# Patient Record
Sex: Male | Born: 1955 | Race: White | Hispanic: No | Marital: Married | Smoking: Former smoker
Health system: Southern US, Community
[De-identification: ages and names within clinical notes are randomized; demographics above are authoritative.]

## PROBLEM LIST (undated history)

## (undated) DIAGNOSIS — Z860100 Personal history of colon polyps, unspecified: Secondary | ICD-10-CM

## (undated) DIAGNOSIS — M51369 Other intervertebral disc degeneration, lumbar region without mention of lumbar back pain or lower extremity pain: Secondary | ICD-10-CM

## (undated) DIAGNOSIS — Z8489 Family history of other specified conditions: Secondary | ICD-10-CM

## (undated) DIAGNOSIS — I10 Essential (primary) hypertension: Secondary | ICD-10-CM

## (undated) DIAGNOSIS — E7800 Pure hypercholesterolemia, unspecified: Secondary | ICD-10-CM

## (undated) DIAGNOSIS — E785 Hyperlipidemia, unspecified: Secondary | ICD-10-CM

## (undated) DIAGNOSIS — R972 Elevated prostate specific antigen [PSA]: Secondary | ICD-10-CM

## (undated) DIAGNOSIS — F419 Anxiety disorder, unspecified: Secondary | ICD-10-CM

## (undated) DIAGNOSIS — C61 Malignant neoplasm of prostate: Secondary | ICD-10-CM

## (undated) DIAGNOSIS — J309 Allergic rhinitis, unspecified: Secondary | ICD-10-CM

## (undated) DIAGNOSIS — R7301 Impaired fasting glucose: Secondary | ICD-10-CM

## (undated) HISTORY — PX: COLONOSCOPY: SHX174

## (undated) HISTORY — DX: Impaired fasting glucose: R73.01

## (undated) HISTORY — DX: Personal history of colon polyps, unspecified: Z86.0100

## (undated) HISTORY — DX: Pure hypercholesterolemia, unspecified: E78.00

## (undated) HISTORY — DX: Hyperlipidemia, unspecified: E78.5

## (undated) HISTORY — DX: Other intervertebral disc degeneration, lumbar region without mention of lumbar back pain or lower extremity pain: M51.369

## (undated) HISTORY — DX: Allergic rhinitis, unspecified: J30.9

---

## 2011-03-22 HISTORY — PX: FOOT SURGERY: SHX648

## 2012-02-06 ENCOUNTER — Other Ambulatory Visit (HOSPITAL_COMMUNITY): Payer: Self-pay | Admitting: Urology

## 2012-02-06 DIAGNOSIS — R972 Elevated prostate specific antigen [PSA]: Secondary | ICD-10-CM

## 2012-02-20 ENCOUNTER — Other Ambulatory Visit (HOSPITAL_COMMUNITY): Payer: Self-pay | Admitting: Urology

## 2012-02-20 ENCOUNTER — Ambulatory Visit (HOSPITAL_COMMUNITY)
Admission: RE | Admit: 2012-02-20 | Discharge: 2012-02-20 | Disposition: A | Discharge status: Home / Self Care | Payer: PRIVATE HEALTH INSURANCE | Source: Ambulatory Visit | Attending: Urology | Admitting: Urology

## 2012-02-20 ENCOUNTER — Other Ambulatory Visit: Payer: Self-pay | Admitting: Urology

## 2012-02-20 DIAGNOSIS — IMO0002 Reserved for concepts with insufficient information to code with codable children: Secondary | ICD-10-CM

## 2012-02-20 DIAGNOSIS — R972 Elevated prostate specific antigen [PSA]: Secondary | ICD-10-CM | POA: Insufficient documentation

## 2012-02-20 LAB — POCT I-STAT, CHEM 8
BUN: 20 mg/dL (ref 6–23)
Creatinine, Ser: 1.2 mg/dL (ref 0.50–1.35)
Potassium: 3.4 mEq/L — ABNORMAL LOW (ref 3.5–5.1)
Sodium: 142 mEq/L (ref 135–145)
TCO2: 29 mmol/L (ref 0–100)

## 2012-02-20 MED ORDER — GADOBENATE DIMEGLUMINE 529 MG/ML IV SOLN
20.0000 mL | Freq: Once | INTRAVENOUS | Status: AC | PRN
  Administered 2012-02-20: 20 mL via INTRAVENOUS

## 2013-02-16 IMAGING — CR DG ORBITS FOR FOREIGN BODY
2 series · 2 of 2 positions shown · non-contrast
Comparison: None.

CLINICAL DATA: Metal exposure to the eyes.  Pre MR evaluation.

ORBITS FOR FOREIGN BODY - 2 VIEW

[w waters *]
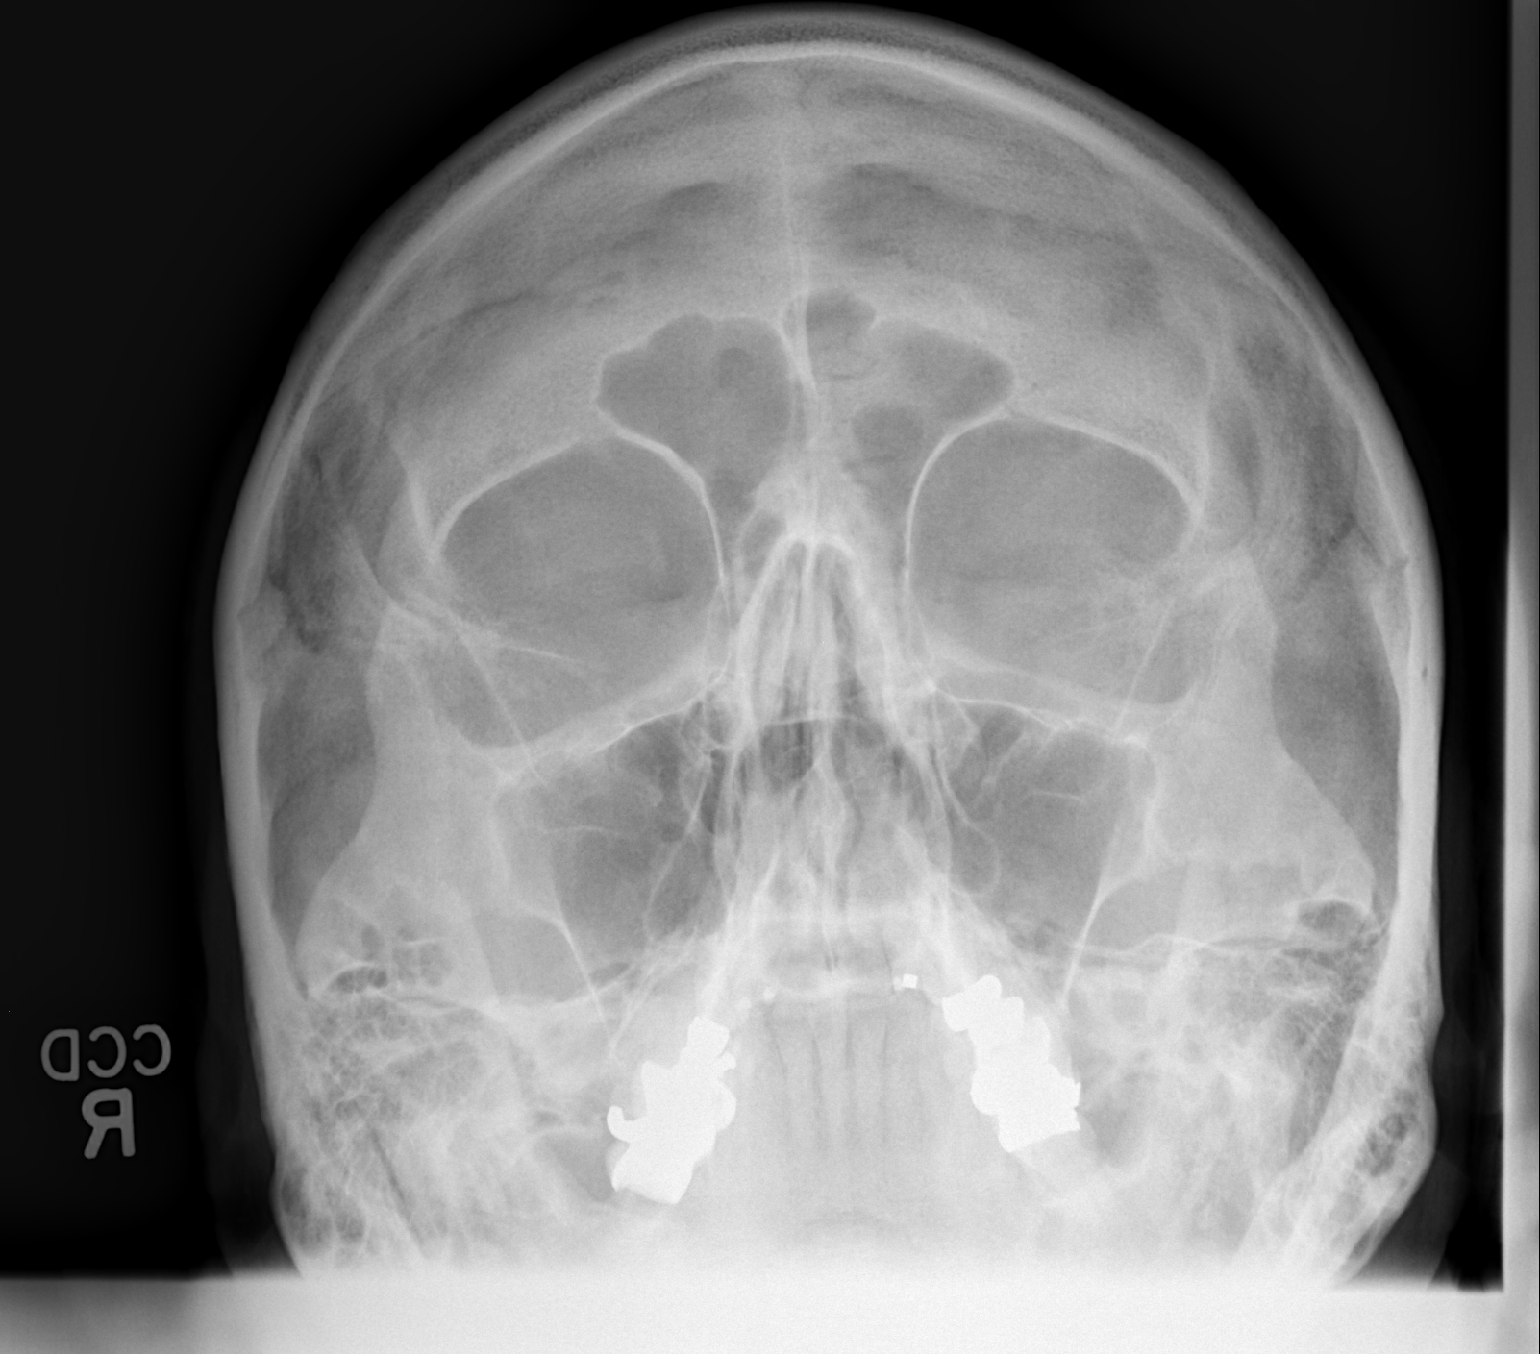

[w waters]
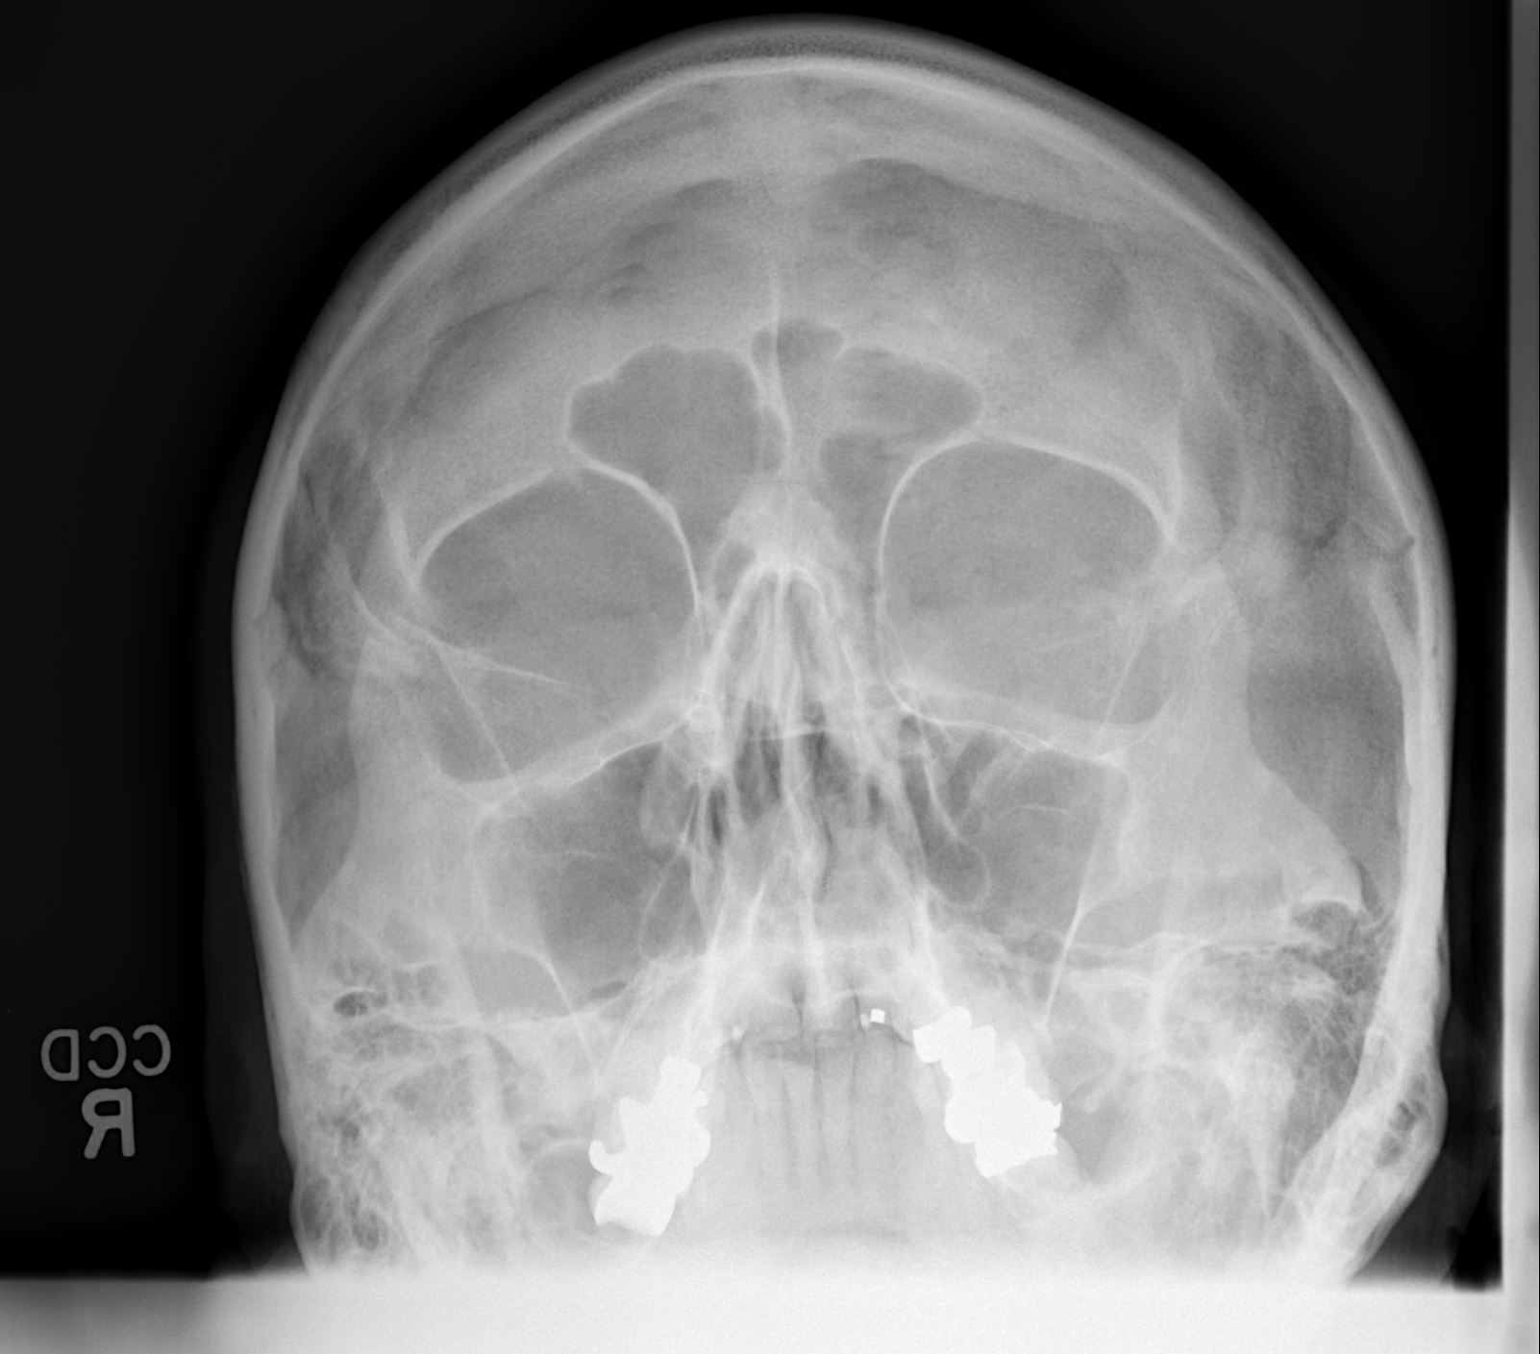

[2 of 2 positions shown; findings below may reference images not displayed]

FINDINGS: Normal appearing orbits with no orbital metallic foreign
bodies seen.
IMPRESSION: No orbital metallic foreign body.

## 2013-09-23 ENCOUNTER — Emergency Department (INDEPENDENT_AMBULATORY_CARE_PROVIDER_SITE_OTHER): Payer: No Typology Code available for payment source

## 2013-09-23 ENCOUNTER — Emergency Department (INDEPENDENT_AMBULATORY_CARE_PROVIDER_SITE_OTHER)
Admission: EM | Admit: 2013-09-23 | Discharge: 2013-09-23 | Disposition: A | Discharge status: Home / Self Care | Payer: No Typology Code available for payment source | Source: Home / Self Care | Attending: Emergency Medicine | Admitting: Emergency Medicine

## 2013-09-23 ENCOUNTER — Encounter (HOSPITAL_COMMUNITY): Payer: Self-pay | Admitting: Emergency Medicine

## 2013-09-23 DIAGNOSIS — M76899 Other specified enthesopathies of unspecified lower limb, excluding foot: Secondary | ICD-10-CM

## 2013-09-23 DIAGNOSIS — M6580 Other synovitis and tenosynovitis, unspecified site: Secondary | ICD-10-CM

## 2013-09-23 HISTORY — DX: Essential (primary) hypertension: I10

## 2013-09-23 MED ORDER — HYDROCODONE-ACETAMINOPHEN 5-325 MG PO TABS: ORAL_TABLET | ORAL | Status: DC

## 2013-09-23 MED ORDER — MELOXICAM 15 MG PO TABS: 15.0000 mg | ORAL_TABLET | Freq: Every day | ORAL | Status: DC

## 2013-09-23 MED ORDER — PREDNISONE 20 MG PO TABS: ORAL_TABLET | ORAL | Status: DC

## 2013-09-23 NOTE — Discharge Instructions (Signed)
Knee pain can be caused by many conditions:  Osteoarthritis, gout, bursitis, tendonitis, cartiledge damage, condromalacia patella, patellofemoral syndrome, and ligament sprain to name just a few.  Often some simple conservative measures can help alleviate the pain.  Do not do the following:  Avoid squatting and doing deep knee bends.  This puts too much of load on your cartiledges and tendons.  If you do a knee bend, go only half way down, flexing your knee no more than 90 degrees.  Do the following:  If you are overweight or obese, lose weight.  This makes for a lot less load on your knee joints.  If you use tobacco, quit.  Nicotine causes spasm of the small arteries, decreases blood flow, and impairs your body's normal ability to repair damage.  If your knee is acutely inflamed, use the principles of RICE (rest, ice, compression, and elevation).  Wearing a knee brace can help.  These are usually made of neoprene and can be purchased over the counter at the drug store.  Use of over the counter pain meds can be of help.  Tylenol (or acetaminophen) is the safest to use.  It often helps to take this regularly.  You can take up to 2 325 mg tablets 5 times daily, but it best to start out much lower that that, perhaps 2 325 mg tablets twice daily, then increase from there. People who are on the blood thinner warfarin have to be careful about taking high doses of Tylenol.  For people who are able to tolerate them, ibuprofen and naproxyn can also help with the pain.  You should discuss these agents with your physician before taking them.  People with chronic kidney disease, hypertension, peptic ulcer disease, and reflux can suffer adverse side effects. They should not be taken with warfarin. The maximum dosage of ibuprofen is 800 mg 3 times daily with meals.  The maximum dosage of naprosyn is 2 and 1/2 tablets twice daily with food, but again, start out low and gradually increase the dose until adequate  pain relief is achieved. Ibuprofen and naprosyn should always be taken with food.  People with cartiledge injury or osteoarthritis may find glucosamine to be helpful.  This is an over-the-counter supplement that helps nourish and repair cartiledge.  The dose is 500 mg 3 times daily or 1500 mg taken in a single dose. This can take several months to work and it doesn't always work.    For people with knee pain on just one side, use of a cane held in the hand on the same side as the knee pain takes some of the stress off the knee joint and can make a big difference in knee pain.  Wearing good shoes with adequate arch support is essential.  Regular exercise is of utmost importance.  Swimming, water aerobics, or use of an elliptical exerciser put the least stress on the knees of any exercise.  Finally doing the exercises below can be very helpful.  They tend to strengthen the muscles around the knee and provide extra support and stability.  Try to do them twice a day followed by ice for 10 minutes.

## 2013-09-23 NOTE — ED Provider Notes (Signed)
Chief Complaint   Chief Complaint  Patient presents with  . Knee Pain    History of Present Illness   Gerald Wolfe is a 58 year old male who is on his feet for hours per day and his work. He's had a three-week history of left medial knee pain. He denies any injury or swelling. The pain is worse with walking, climbing steps, or if he twists his foot when he sitting or lying. It is better with Aleve. There is no locking, popping, catching of the joint. No weakness or giving way the name. He denies any injury or trauma.  Review of Systems   Other than as noted above, the patient denies any of the following symptoms: Systemic:  No fevers or chills. Musculoskeletal:  No arthritis, swelling, or joint pain.  Neurological:  No muscular weakness or paresthesias.  [place]   Past medical history, family history, social history, meds, and allergies were reviewed.   He's allergic to penicillin. He takes blood pressure medication including lisinopril for high blood pressure.  Physical Examination   Vital signs:  BP 176/83  Pulse 55  Temp(Src) 98.3 F (36.8 C) (Oral)  Resp 12  SpO2 100% Gen:  Alert and oriented times 3.  In no distress. Musculoskeletal: There is tenderness to palpation over the insertion of the semi-tendinosis and semimembranosus. There is no swelling. The knee has a full range of motion with some pain on flexion.   McMurray's test was negative.  Lachman's test was negative.  Anterior drawer test was negative.   Varus and valgus stress negative.  Otherwise, all joints had a full a ROM with no swelling, bruising or deformity.  No edema, pulses full. Extremities were warm and pink.  Capillary refill was brisk.  Skin:  Clear, warm and dry.  No rash. Neuro:  Alert and oriented times 3.  Muscle strength was normal.  Sensation was intact to light touch.   Radiology   Dg Knee Complete 4 Views Left  09/23/2013   CLINICAL DATA:  Left knee pain.  EXAM: LEFT KNEE - COMPLETE 4+ VIEW   COMPARISON:  None.  FINDINGS: Mild proliferative changes involving the tibial spines. No evidence of fracture, dislocation or joint effusion. No bony lesions are seen. Soft tissues are unremarkable.  IMPRESSION: No acute findings. Mild proliferative changes seen involving the tibial spines.   Electronically Signed   By: Aletta Edouard M.D.   On: 09/23/2013 09:57   I reviewed the images independently and personally and concur with the radiologist's findings.    Course in [facility] [place]   He was placed in a knee sleeve.  Assessment   The encounter diagnosis was Knee tendonitis.  Suspect probable tendinopathy at the insertion of the semi-tendinosis/semimembranosus. Differential diagnosis includes bursitis or car tear.  Plan    1.  Meds:  The following meds were prescribed:   Discharge Medication List as of 09/23/2013 10:18 AM    START taking these medications   Details  HYDROcodone-acetaminophen (NORCO/VICODIN) 5-325 MG per tablet 1 to 2 tabs every 4 to 6 hours as needed for pain., Print    meloxicam (MOBIC) 15 MG tablet Take 1 tablet (15 mg total) by mouth daily., Starting 09/23/2013, Until Discontinued, Normal    predniSONE (DELTASONE) 20 MG tablet Take 3 daily for 5 days, 2 daily for 5 days, 1 daily for 5 days., Normal        2.  Patient Education/Counseling:  The patient was given appropriate handouts, self care instructions, and  instructed in symptomatic relief, including rest and activity, elevation, application of ice and compression.  Given the exercises.  3.  Follow up:  The patient was told to follow up here if no better in 3 to 4 days, or sooner if becoming worse in any way, and given some red flag symptoms such as worsening pain or neurological symptoms which would prompt immediate return.  Followup with Dr. Maxie Better if no improvement in 2 weeks.     Harden Mo, MD 09/23/13 [phone]

## 2013-09-23 NOTE — ED Notes (Signed)
Provider in before nurse.  Pt triaged and assessed by provider

## 2014-04-09 ENCOUNTER — Other Ambulatory Visit: Payer: Self-pay | Admitting: Family Medicine

## 2014-04-09 ENCOUNTER — Ambulatory Visit
Admission: RE | Admit: 2014-04-09 | Discharge: 2014-04-09 | Disposition: A | Discharge status: Home / Self Care | Payer: No Typology Code available for payment source | Source: Ambulatory Visit | Attending: Family Medicine | Admitting: Family Medicine

## 2014-04-09 DIAGNOSIS — M25551 Pain in right hip: Secondary | ICD-10-CM

## 2015-01-09 ENCOUNTER — Other Ambulatory Visit: Payer: Self-pay | Admitting: Urology

## 2015-01-16 ENCOUNTER — Encounter (HOSPITAL_BASED_OUTPATIENT_CLINIC_OR_DEPARTMENT_OTHER): Payer: Self-pay | Admitting: *Deleted

## 2015-01-16 NOTE — Progress Notes (Addendum)
NPO AFTER MN.  ARRIVE AT 0930.  NEEDS ISTAT 8 AND EKG.  WILL TAKE METOPROLOL AM DOS W/ SIPS OF WATER AND DO FLEET ENEMA.  PT TO CALL BACK WITH MEDICATION DOSEAGE OR BRING DOS TO UPDATE.

## 2015-01-23 ENCOUNTER — Encounter (HOSPITAL_BASED_OUTPATIENT_CLINIC_OR_DEPARTMENT_OTHER)
Admission: RE | Disposition: A | Discharge status: Home / Self Care | Payer: Self-pay | Source: Ambulatory Visit | Attending: Urology

## 2015-01-23 ENCOUNTER — Ambulatory Visit (HOSPITAL_BASED_OUTPATIENT_CLINIC_OR_DEPARTMENT_OTHER): Payer: BLUE CROSS/BLUE SHIELD | Admitting: Anesthesiology

## 2015-01-23 ENCOUNTER — Encounter (HOSPITAL_BASED_OUTPATIENT_CLINIC_OR_DEPARTMENT_OTHER): Payer: Self-pay | Admitting: Anesthesiology

## 2015-01-23 ENCOUNTER — Other Ambulatory Visit: Payer: Self-pay

## 2015-01-23 ENCOUNTER — Ambulatory Visit (HOSPITAL_BASED_OUTPATIENT_CLINIC_OR_DEPARTMENT_OTHER)
Admission: RE | Admit: 2015-01-23 | Discharge: 2015-01-23 | Disposition: A | Discharge status: Home / Self Care | Payer: BLUE CROSS/BLUE SHIELD | Source: Ambulatory Visit | Attending: Urology | Admitting: Urology

## 2015-01-23 DIAGNOSIS — I10 Essential (primary) hypertension: Secondary | ICD-10-CM | POA: Diagnosis not present

## 2015-01-23 DIAGNOSIS — Z79899 Other long term (current) drug therapy: Secondary | ICD-10-CM | POA: Diagnosis not present

## 2015-01-23 DIAGNOSIS — Z87891 Personal history of nicotine dependence: Secondary | ICD-10-CM | POA: Diagnosis not present

## 2015-01-23 DIAGNOSIS — C61 Malignant neoplasm of prostate: Secondary | ICD-10-CM | POA: Diagnosis not present

## 2015-01-23 DIAGNOSIS — R972 Elevated prostate specific antigen [PSA]: Secondary | ICD-10-CM | POA: Diagnosis present

## 2015-01-23 HISTORY — DX: Anxiety disorder, unspecified: F41.9

## 2015-01-23 HISTORY — DX: Malignant neoplasm of prostate: C61

## 2015-01-23 HISTORY — DX: Elevated prostate specific antigen (PSA): R97.20

## 2015-01-23 HISTORY — PX: PROSTATE BIOPSY: SHX241

## 2015-01-23 LAB — POCT I-STAT, CHEM 8
BUN: 19 mg/dL (ref 6–20)
CALCIUM ION: 1.21 mmol/L (ref 1.13–1.30)
CREATININE: 1.1 mg/dL (ref 0.61–1.24)
Chloride: 104 mmol/L (ref 101–111)
Glucose, Bld: 96 mg/dL (ref 65–99)
HEMATOCRIT: 45 % (ref 39.0–52.0)
HEMOGLOBIN: 15.3 g/dL (ref 13.0–17.0)
Potassium: 3.4 mmol/L — ABNORMAL LOW (ref 3.5–5.1)
SODIUM: 145 mmol/L (ref 135–145)
TCO2: 24 mmol/L (ref 0–100)

## 2015-01-23 SURGERY — BIOPSY, PROSTATE, RECTAL APPROACH, WITH US GUIDANCE
Anesthesia: General | Site: Prostate

## 2015-01-23 MED ORDER — LACTATED RINGERS IV SOLN
INTRAVENOUS | Status: DC
  Administered 2015-01-23: 10:00:00 via INTRAVENOUS
  Filled 2015-01-23: qty 1000

## 2015-01-23 MED ORDER — FENTANYL CITRATE (PF) 100 MCG/2ML IJ SOLN
INTRAMUSCULAR | Status: DC | PRN
  Administered 2015-01-23 (×4): 25 ug via INTRAVENOUS

## 2015-01-23 MED ORDER — MIDAZOLAM HCL 2 MG/2ML IJ SOLN
INTRAMUSCULAR | Status: AC
  Filled 2015-01-23: qty 2

## 2015-01-23 MED ORDER — LIDOCAINE HCL 2 % IJ SOLN
INTRAMUSCULAR | Status: DC | PRN
  Administered 2015-01-23: 10 mL

## 2015-01-23 MED ORDER — KETOROLAC TROMETHAMINE 30 MG/ML IJ SOLN
INTRAMUSCULAR | Status: DC | PRN
  Administered 2015-01-23: 30 mg via INTRAVENOUS

## 2015-01-23 MED ORDER — FENTANYL CITRATE (PF) 100 MCG/2ML IJ SOLN: INTRAMUSCULAR | Status: DC | PRN

## 2015-01-23 MED ORDER — PROPOFOL 10 MG/ML IV BOLUS: INTRAVENOUS | Status: DC | PRN

## 2015-01-23 MED ORDER — DEXAMETHASONE SODIUM PHOSPHATE 10 MG/ML IJ SOLN
INTRAMUSCULAR | Status: DC | PRN
  Administered 2015-01-23: 10 mg via INTRAVENOUS

## 2015-01-23 MED ORDER — ONDANSETRON HCL 4 MG/2ML IJ SOLN
INTRAMUSCULAR | Status: DC | PRN
  Administered 2015-01-23: 4 mg via INTRAVENOUS

## 2015-01-23 MED ORDER — GENTAMICIN IN SALINE 1.6-0.9 MG/ML-% IV SOLN
80.0000 mg | INTRAVENOUS | Status: AC
  Administered 2015-01-23: 80 mg via INTRAVENOUS
  Filled 2015-01-23 (×2): qty 50

## 2015-01-23 MED ORDER — TRAMADOL HCL 50 MG PO TABS: 50.0000 mg | ORAL_TABLET | Freq: Four times a day (QID) | ORAL | Status: DC | PRN

## 2015-01-23 MED ORDER — FENTANYL CITRATE (PF) 100 MCG/2ML IJ SOLN
25.0000 ug | INTRAMUSCULAR | Status: DC | PRN
  Filled 2015-01-23: qty 1

## 2015-01-23 MED ORDER — PROPOFOL 10 MG/ML IV BOLUS
INTRAVENOUS | Status: DC | PRN
  Administered 2015-01-23: 200 mg via INTRAVENOUS

## 2015-01-23 MED ORDER — MIDAZOLAM HCL 5 MG/5ML IJ SOLN
INTRAMUSCULAR | Status: DC | PRN
  Administered 2015-01-23: 2 mg via INTRAVENOUS

## 2015-01-23 MED ORDER — PROMETHAZINE HCL 25 MG/ML IJ SOLN
6.2500 mg | INTRAMUSCULAR | Status: DC | PRN
  Filled 2015-01-23: qty 1

## 2015-01-23 MED ORDER — FENTANYL CITRATE (PF) 100 MCG/2ML IJ SOLN
INTRAMUSCULAR | Status: AC
  Filled 2015-01-23: qty 4

## 2015-01-23 MED ORDER — LIDOCAINE HCL (CARDIAC) 20 MG/ML IV SOLN
INTRAVENOUS | Status: DC | PRN
  Administered 2015-01-23: 80 mg via INTRAVENOUS

## 2015-01-23 SURGICAL SUPPLY — 6 items
GLOVE BIO SURGEON STRL SZ8 (GLOVE) ×2 IMPLANT
NEEDLE SPNL 22GX7 QUINCKE BK (NEEDLE) ×2 IMPLANT
SURGILUBE 2OZ TUBE FLIPTOP (MISCELLANEOUS) ×2 IMPLANT
SYR CONTROL 10ML LL (SYRINGE) ×2 IMPLANT
TOWEL OR 17X24 6PK STRL BLUE (TOWEL DISPOSABLE) ×2 IMPLANT
UNDERPAD 30X30 INCONTINENT (UNDERPADS AND DIAPERS) ×6 IMPLANT

## 2015-01-23 NOTE — Discharge Instructions (Signed)
°  Post Anesthesia Home Care Instructions  Activity: Get plenty of rest for the remainder of the day. A responsible adult should stay with you for 24 hours following the procedure.  For the next 24 hours, DO NOT: -Drive a car -Paediatric nurse -Drink alcoholic beverages -Take any medication unless instructed by your physician -Make any legal decisions or sign important papers.  Meals: Start with liquid foods such as gelatin or soup. Progress to regular foods as tolerated. Avoid greasy, spicy, heavy foods. If nausea and/or vomiting occur, drink only clear liquids until the nausea and/or vomiting subsides. Call your physician if vomiting continues.  Special Instructions/Symptoms: Your throat may feel dry or sore from the anesthesia or the breathing tube placed in your throat during surgery. If this causes discomfort, gargle with warm salt water. The discomfort should disappear within 24 hours.  Call your surgeon if you experience:   1.  Fever over 101.0. 2.  Inability to urinate. 3.  Nausea and/or vomiting. 4.  Extreme swelling or bruising at the surgical site. 5.  Continued bleeding from the rectum. 6.  Increased pain, redness or drainage from the incision. 7.  Problems related to your pain medication. 8.  Any problems and/or concerns

## 2015-01-23 NOTE — Transfer of Care (Signed)
Immediate Anesthesia Transfer of Care Note  Patient: Gerald Wolfe  Procedure(s) Performed: Procedure(s) (LRB): SATURATED BIOPSY TRANSRECTAL ULTRASONIC PROSTATE (TUBP) (N/A)  Patient Location: PACU  Anesthesia Type: General  Level of Consciousness: awake, sedated, patient cooperative and responds to stimulation  Airway & Oxygen Therapy: Patient Spontanous Breathing and Patient connected to face mask oxygen  Post-op Assessment: Report given to PACU RN, Post -op Vital signs reviewed and stable and Patient moving all extremities  Post vital signs: Reviewed and stable  Complications: No apparent anesthesia complications

## 2015-01-23 NOTE — Brief Op Note (Signed)
01/23/2015  11:28 AM  PATIENT:  Gerald Wolfe  59 y.o. male  PRE-OPERATIVE DIAGNOSIS:  ELEVATED PROSTATIC SPECIFIC ANTIGEN  POST-OPERATIVE DIAGNOSIS:  elevated PSA  PROCEDURE:  Procedure(s): SATURATED BIOPSY TRANSRECTAL ULTRASONIC PROSTATE (TUBP) (N/A)  SURGEON:  Surgeon(s) and Role:    * Cleon Gustin, MD - Primary  PHYSICIAN ASSISTANT:   ASSISTANTS: none   ANESTHESIA:   general  EBL:  Total I/O In: 100 [I.V.:100] Out: -   BLOOD ADMINISTERED:none  DRAINS: none   LOCAL MEDICATIONS USED:  LIDOCAINE   SPECIMEN:  Source of Specimen:  prostate  DISPOSITION OF SPECIMEN:  PATHOLOGY  COUNTS:  YES  TOURNIQUET:  * No tourniquets in log *  DICTATION: .Note written in EPIC  PLAN OF CARE: Discharge to home after PACU  PATIENT DISPOSITION:  PACU - hemodynamically stable.   Delay start of Pharmacological VTE agent (>24hrs) due to surgical blood loss or risk of bleeding: not applicable

## 2015-01-23 NOTE — H&P (Signed)
Urology Admission H&P  Chief Complaint: elevated PSA  History of Present Illness: Gerald Wolfe is a 59yo with a hx of elevated PSA and 4 negative prostate biopsies. He has mild LUTS  Past Medical History  Diagnosis Date  . Hypertension   . Elevated PSA   . Anxiety    Past Surgical History  Procedure Laterality Date  . Foot surgery Left 2013    debridement/ removal spur  . Colonoscopy      Home Medications:  Prescriptions prior to admission  Medication Sig Dispense Refill Last Dose  . ALPRAZolam (XANAX PO) Take by mouth as needed.   01/22/2015 at Unknown time  . ciprofloxacin (CIPRO) 500 MG/5ML (10%) suspension Take 500 mg by mouth 2 (two) times daily.   01/23/2015 at 0800  . LISINOPRIL PO Take 1 tablet by mouth every morning.    01/22/2015 at Unknown time  . METOPROLOL TARTRATE PO Take 1 tablet by mouth every morning.   01/23/2015 at 0800  . Multiple Vitamins-Minerals (PA MENS VITAPAK PO) Take by mouth every other day. [employer] Brand vitamin pack   Past Week at Unknown time   Allergies:  Allergies  Allergen Reactions  . Penicillins Hives    History reviewed. No pertinent family history. Social History:  reports that he quit smoking about 38 years ago. His smoking use included Cigarettes. He quit after 7 years of use. He has never used smokeless tobacco. He reports that he drinks about 3.0 - 4.2 oz of alcohol per week. He reports that he does not use illicit drugs.  Review of Systems  Genitourinary: Positive for urgency and frequency.  All other systems reviewed and are negative.   Physical Exam:  Vital signs in last 24 hours: Temp:  [98.5 F (36.9 C)] 98.5 F (36.9 C) (11/04 0957) Pulse Rate:  [76] 76 (11/04 0957) Resp:  [16] 16 (11/04 0957) BP: (159)/(93) 159/93 mmHg (11/04 0957) SpO2:  [100 %] 100 % (11/04 0957) Weight:  [108.183 kg (238 lb 8 oz)] 108.183 kg (238 lb 8 oz) (11/04 0957) Physical Exam  Constitutional: He is oriented to person, place, and time. He appears  well-developed and well-nourished.  HENT:  Head: Normocephalic and atraumatic.  Eyes: EOM are normal. Pupils are equal, round, and reactive to light.  Neck: Normal range of motion. No thyromegaly present.  Cardiovascular: Normal rate and regular rhythm.   Respiratory: Effort normal. No respiratory distress.  GI: Soft. He exhibits no distension.  Musculoskeletal: Normal range of motion.  Neurological: He is alert and oriented to person, place, and time.  Skin: Skin is warm and dry.  Psychiatric: He has a normal mood and affect. His behavior is normal. Judgment and thought content normal.    Laboratory Data:  No results found for this or any previous visit (from the past 24 hour(s)). No results found for this or any previous visit (from the past 240 hour(s)). Creatinine: No results for input(s): CREATININE in the last 168 hours. Baseline Creatinine: unknown  Impression/Assessment:  59yo with a hx of elevated PSA  Plan:  The risks/benefits/alternatives to saturation biopsy was explained to the patient and he understands and wishes to proceed with surgery  Malena Timpone L 01/23/2015, 10:33 AM

## 2015-01-23 NOTE — Anesthesia Procedure Notes (Signed)
Procedure Name: LMA Insertion Date/Time: 01/23/2015 11:12 AM Performed by: Justice Rocher Pre-anesthesia Checklist: Patient identified, Emergency Drugs available, Suction available and Patient being monitored Patient Re-evaluated:Patient Re-evaluated prior to inductionOxygen Delivery Method: Circle System Utilized Preoxygenation: Pre-oxygenation with 100% oxygen Intubation Type: IV induction Ventilation: Mask ventilation without difficulty LMA: LMA inserted LMA Size: 5.0 Number of attempts: 1 Airway Equipment and Method: Bite block Placement Confirmation: positive ETCO2 Tube secured with: Tape Dental Injury: Teeth and Oropharynx as per pre-operative assessment

## 2015-01-23 NOTE — Anesthesia Preprocedure Evaluation (Signed)
Anesthesia Evaluation  Patient identified by MRN, date of birth, ID band Patient awake    Reviewed: Allergy & Precautions, NPO status , Patient's Chart, lab work & pertinent test results  Airway Mallampati: II  TM Distance: >3 FB Neck ROM: Full    Dental no notable dental hx.    Pulmonary neg pulmonary ROS, former smoker,    Pulmonary exam normal breath sounds clear to auscultation       Cardiovascular hypertension, Pt. on medications and Pt. on home beta blockers Normal cardiovascular exam Rhythm:Regular Rate:Normal     Neuro/Psych Anxiety negative neurological ROS     GI/Hepatic negative GI ROS, Neg liver ROS,   Endo/Other  negative endocrine ROS  Renal/GU negative Renal ROS  negative genitourinary   Musculoskeletal negative musculoskeletal ROS (+)   Abdominal   Peds negative pediatric ROS (+)  Hematology negative hematology ROS (+)   Anesthesia Other Findings   Reproductive/Obstetrics negative OB ROS                             Anesthesia Physical Anesthesia Plan  ASA: II  Anesthesia Plan: General   Post-op Pain Management:    Induction: Intravenous  Airway Management Planned: LMA  Additional Equipment:   Intra-op Plan:   Post-operative Plan: Extubation in OR  Informed Consent: I have reviewed the patients History and Physical, chart, labs and discussed the procedure including the risks, benefits and alternatives for the proposed anesthesia with the patient or authorized representative who has indicated his/her understanding and acceptance.   Dental advisory given  Plan Discussed with: CRNA  Anesthesia Plan Comments:         Anesthesia Quick Evaluation

## 2015-01-23 NOTE — Op Note (Signed)
Pre op diagnosis: Elevated PSA  Post op diagnosis: Elevated PSA        Procedure: Transrectal ultrasound of the prostate, Ultrasound Guided Saturation Prostate needle biopsy.   Attending: Nicolette Bang  Anesthesia: General  EBL: minimal  Antibiotics: Gentamicin  Drains: none  Findings: 115g prostate with prominent median lobe. Prostate measurements: width 6.51cm, height 5.59cm, length 6.07cm. 2 hyperechoic lesions in the left lobe and right apex   Indications: Pt is a 59yo male with a history of elevated PSA and multiple prveious negative biopsies. After discussing workup he has elected to proceed with saturation prostate biopsy  Procedure in detail: Prior to procedure consent was obtained. The patient was brought to the OR and a brief timeout was done to ensure correct patient, correct procedure, and correct site. General anesthesia was administered and patient was placed in the dorsal lithotomy position. The prostate size was estimated to be 40g by digital rectal exam. The 10 MHz transrectal ultrasound probe was placed into the rectum. Prostate width measured 6.51cm, height of 5.59cm and length of 6.07cm . Prostate volume was measured to be 115 cc. The biopsy cores were obtained using direct, real-time ultrasound guidance utilizing a standard 12core pattern with one core from the right apex lateral using real time ultrasound guidance to direct the biopsy core being taken from this location, right apex medial using real time ultrasound guidance to direct the biopsy core being taken from this location, left apex lateral using real time ultrasound guidance to direct the biopsy core being taken from this location, left apex medial using real time ultrasound guidance to direct the biopsy core being taken from this location, 2 right mid lateral using real time ultrasound guidance to direct the biopsy core being taken from this location, 2 right mid medial using real time ultrasound guidance to  direct the biopsy core being taken from this location, left mid lateral using real time ultrasound guidance to direct the biopsy core being taken from this location, left mid medial using real time ultrasound guidance to direct the biopsy core being taken from this location, right base lateral using real time ultrasound guidance to direct the biopsy core being taken from this location, right base medial using real time ultrasound guidance to direct the biopsy core being taken from this location, left base lateral using real time ultrasound guidance to direct the biopsy core being taken from this location and left base medial of the prostate using real time ultrasound guidance to direct the biopsy core being taken from this location. Multiple biopsies were also taken at the left mid medial area where there were hyperechoic lesions. Another right apex biopsy was also taken were there was a hyperechoic lesion. The biopsy cores were placed in buffered formalin and sent to pathology. This then concluded the procedure which was well tolerated by the patient.   Complications:. None.   Condition: Stable, extubated, transferred to PACU  Plan: Patient is to be discharged home. They are to followup in 1 week for pathology discussion

## 2015-01-23 NOTE — Anesthesia Postprocedure Evaluation (Signed)
  Anesthesia Post-op Note  Patient: Gerald Wolfe  Procedure(s) Performed: Procedure(s) (LRB): SATURATED BIOPSY TRANSRECTAL ULTRASONIC PROSTATE (TUBP) (N/A)  Patient Location: PACU  Anesthesia Type: General  Level of Consciousness: awake and alert   Airway and Oxygen Therapy: Patient Spontanous Breathing  Post-op Pain: mild  Post-op Assessment: Post-op Vital signs reviewed, Patient's Cardiovascular Status Stable, Respiratory Function Stable, Patent Airway and No signs of Nausea or vomiting  Last Vitals:  Filed Vitals:   01/23/15 1200  BP: 160/91  Pulse: 69  Temp:   Resp: 13    Post-op Vital Signs: stable   Complications: No apparent anesthesia complications

## 2015-01-26 ENCOUNTER — Encounter (HOSPITAL_BASED_OUTPATIENT_CLINIC_OR_DEPARTMENT_OTHER): Payer: Self-pay | Admitting: Urology

## 2015-01-30 ENCOUNTER — Other Ambulatory Visit: Payer: Self-pay | Admitting: Urology

## 2015-01-30 DIAGNOSIS — C61 Malignant neoplasm of prostate: Secondary | ICD-10-CM

## 2015-02-09 ENCOUNTER — Encounter (HOSPITAL_COMMUNITY)
Admission: RE | Admit: 2015-02-09 | Discharge: 2015-02-09 | Disposition: A | Discharge status: Home / Self Care | Payer: BLUE CROSS/BLUE SHIELD | Source: Ambulatory Visit | Attending: Urology | Admitting: Urology

## 2015-02-09 DIAGNOSIS — C61 Malignant neoplasm of prostate: Secondary | ICD-10-CM | POA: Diagnosis not present

## 2015-02-09 MED ORDER — TECHNETIUM TC 99M MEDRONATE IV KIT
27.5000 | PACK | Freq: Once | INTRAVENOUS | Status: AC | PRN
  Administered 2015-02-09: 27.5 via INTRAVENOUS

## 2015-02-10 ENCOUNTER — Encounter: Payer: Self-pay | Admitting: Radiation Oncology

## 2015-02-10 NOTE — Progress Notes (Addendum)
GU Location of Tumor / Histology: Prostate Cancer  If Prostate Cancer, Gleason Score is ( 3+4=7) and PSA is (15.98 on 01/01/15;(Volume115g, on 01/23/15)  previous volume =65g 06/27/14 PSA+14.30 06/20/13 PSA =13.34    Gerald Wolfe presented  months ago with signs/symptoms [id number] PSA'S and right inguinal pain   Biopsies of  (if applicable) revealed: [id number] 1. Prostate, needle biopsy(ies), right base medial- BENIGN PROSTATIC GLANDS AND STROMA. 2. Prostate, needle biopsy(ies), right base lateral- BENIGN PROSTATIC GLANDS AND STROMA. 3. Prostate, needle biopsy(ies), right mid medial (x2)- BENIGN PROSTATIC GLANDS AND STROMA. 4. Prostate, needle biopsy(ies), right mid lateral- BENIGN PROSTATIC GLANDS AND STROMA. 5. Prostate, needle biopsy(ies), right apex medial (x2)- BENIGN PROSTATIC GLANDS AND STROMA. 6. Prostate, needle biopsy(ies), right apex lateral- BENIGN PROSTATIC GLANDS AND STROMA. 7. Prostate, needle biopsy(ies), left base medial- BENIGN PROSTATIC GLANDS AND STROMA. 8. Prostate, needle biopsy(ies), left base lateral- BENIGN PROSTATIC GLANDS AND STROMA. 9. Prostate, needle biopsy(ies), left mid medial (x2)- BENIGN PROSTATIC GLANDS AND STROMA. 10. Prostate, needle biopsy(ies), left mid lateral- BENIGN PROSTATIC GLANDS AND STROMA. 11. 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 TUMOR INVOLVES APPROXIMATELY 20% NEEDLE CORE AREA.- NEGATIVE FOR PERINEURAL INVASION. 12. Prostate, needle biopsy(ies)left apex lateral- BENIGN PROSTATIC GLANDS AND STROMA  Past/Anticipated interventions by urology, if any: Dr. Nicolette Bang, MD, f/u after Ct scan and Bone scan  Past/Anticipated interventions by medical oncology, if any: None   Weight changes, if any: NO  Bowel/Bladder complaints, if any:BPH with  LUTS,regular bowels  Nausea/Vomiting, if any: No  Pain issues, if any: No  SAFETY ISSUES: No  Prior radiation? NO  Pacemaker/ICD NO?  Is the patient on methotrexate? NO Current Complaints / other details:   Single, smoked cigarettes 1/2ppd for 9 years,quit 25 years ago,alcohol use , Anxiety,    Father Lung cancer,Mother deceased age 8   Allergies: PCNS= Hives   Wt Readings from Last 3 Encounters:  01/23/15 238 lb 8 oz (108.183 kg)  BP 159/76 mmHg  Pulse 74  Temp(Src) 97.8 F (36.6 C) (Oral)  Resp 20  Ht 6' 2.5" (1.892 m)  Wt 245 lb 8 oz (111.358 kg)  BMI 31.11 kg/m2  Wt Readings from Last 3 Encounters:  02/11/15 245 lb 8 oz (111.358 kg)  01/23/15 238 lb 8 oz (108.183 kg)

## 2015-02-11 ENCOUNTER — Ambulatory Visit
Admission: RE | Admit: 2015-02-11 | Discharge: 2015-02-11 | Disposition: A | Discharge status: Home / Self Care | Payer: BLUE CROSS/BLUE SHIELD | Source: Ambulatory Visit | Attending: Radiation Oncology | Admitting: Radiation Oncology

## 2015-02-11 ENCOUNTER — Encounter: Payer: Self-pay | Admitting: Radiation Oncology

## 2015-02-11 VITALS — BP 159/76 | HR 74 | Temp 97.8°F | Resp 20 | Ht 74.5 in | Wt 245.5 lb

## 2015-02-11 DIAGNOSIS — Z801 Family history of malignant neoplasm of trachea, bronchus and lung: Secondary | ICD-10-CM | POA: Insufficient documentation

## 2015-02-11 DIAGNOSIS — C61 Malignant neoplasm of prostate: Secondary | ICD-10-CM | POA: Insufficient documentation

## 2015-02-11 DIAGNOSIS — I10 Essential (primary) hypertension: Secondary | ICD-10-CM | POA: Insufficient documentation

## 2015-02-11 DIAGNOSIS — F419 Anxiety disorder, unspecified: Secondary | ICD-10-CM | POA: Insufficient documentation

## 2015-02-11 DIAGNOSIS — Z832 Family history of diseases of the blood and blood-forming organs and certain disorders involving the immune mechanism: Secondary | ICD-10-CM | POA: Insufficient documentation

## 2015-02-11 HISTORY — DX: Malignant neoplasm of prostate: C61

## 2015-02-11 NOTE — Progress Notes (Signed)
Please see the Nurse Progress Note in the MD Initial Consult Encounter for this patient. 

## 2015-02-11 NOTE — Progress Notes (Signed)
[facility] NEW PATIENT EVALUATION  Name: Gerald Wolfe MRN: [id number]  Date:   02/11/2015           DOB: [birth year]  Status: outpatient   CC: [name], MD  Dr. Nicolette Bang   REFERRING PHYSICIAN:  Dr. 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 male who is seen today through the courtesy of Dr. Alyson Ingles for evaluation of his stage TIc intermediate risk adenocarcinoma prostate.  He was followed by Dr. Janice Norrie for an elevated PSA.  He had benign biopsies back in January 2010 when his PSA was 5.77.  Repeat biopsies in October 2011 were also benign.  I do not have his PSA value from that time.  He had a MRI scan on 02/20/2012 which did not show areas to suggest prostate cancer.  His PSA rose to 13.34 by April 2015, and slightly higher to 14.3 by April 2016.  His last PSA on 01/01/2015 was 15.98.  On 01/23/2015 he underwent saturation biopsies by Dr. Alyson Ingles.  A total of 15 biopsies were taken.  In one biopsy he had Gleason 7 (3+4) involving the left medial apex.  Tumor involved 20% of one core.  His gland volume was 115 mL.  He is doing well from a GU and GI standpoint.  His I PSS score 7.  He does have some degree erectile dysfunction which improved with Viagra.  His staging bone scan on November 21 was without evidence for metastatic disease.  I understand that he had a CT scan at [facility] which I do not have the report.  PREVIOUS RADIATION THERAPY: No   PAST MEDICAL HISTORY:  has a past medical history of Hypertension; Elevated PSA; Anxiety; and Prostate cancer ([place]) (01/23/15).     PAST SURGICAL HISTORY:  Past Surgical History  Procedure Laterality Date  . Foot surgery Left 2013    debridement/ removal spur  . Colonoscopy    . Prostate biopsy N/A 01/23/2015    Procedure: SATURATED BIOPSY TRANSRECTAL ULTRASONIC PROSTATE (TUBP);   Surgeon: Cleon Gustin, MD;  [facility];  Service: Urology;  Laterality: N/A;     FAMILY HISTORY: family history includes Cancer in his father.  His father died of lung cancer 8.  His mother died from some blood disorder at 21.  No family history of prostate cancer.   SOCIAL HISTORY:  reports that he quit smoking about 38 years ago. His smoking use included Cigarettes. He quit after 7 years of use. He has never used smokeless tobacco. He reports that he drinks about 3.0 - 4.2 oz of alcohol per week. He reports that he does not use illicit drugs.  Never married, no children.  He works as a Administrator, sports.   ALLERGIES: Penicillins   MEDICATIONS:  Current Outpatient Prescriptions  Medication Sig Dispense Refill  . ALPRAZolam (XANAX PO) Take by mouth as needed.    Marland Kitchen LISINOPRIL PO Take 1 tablet by mouth every morning. 20.25 tablet    . METOPROLOL TARTRATE PO Take 50 mg by mouth every morning.     . Multiple Vitamins-Minerals (PA MENS VITAPAK PO) Take by mouth every other day. [employer] Brand vitamin pack    . traMADol (ULTRAM) 50 MG tablet Take 1 tablet (50 mg total) by mouth every 6 (six) hours as needed. (Patient not taking: Reported on 02/11/2015) 15 tablet 0  .  VIAGRA 100 MG tablet Take 100 mg by mouth See admin instructions.  11   No current facility-administered medications for this encounter.     REVIEW OF SYSTEMS:  Pertinent items are noted in HPI.    PHYSICAL EXAM:  height is 6' 2.5" (1.892 m) and weight is 245 lb 8 oz (111.358 kg). His oral temperature is 97.8 F (36.6 C). His blood pressure is 159/76 and his pulse is 74. His respiration is 20.   Alert and oriented.  Rectal examination not performed today.   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  I explained to the patient and his significant other that his prognosis is related to his stage, Gleason score, and PSA level.  His stage is favorable and also consider his PSA to be a favorable considering his gland volume.  His Gleason score is of intermediate favorability.  Other prognostic factors include disease volume and PSA doubling time and these appear to be favorable.  We discussed surgery versus active surveillance versus radiation therapy.  We discussed the potential acute and late toxicities of radiation therapy.  I do not recommend active surveillance in view of his age of 57 along with a Gleason score of 7.  Radiation therapy options are limited to external beam/IMRT.  Even with downsizing, I do not feel that he would be a candidate for seed implantation based on his gland volume.  With external beam/IMRT, I feel that he would benefit from downsizing with 3 months of androgen deprivation therapy which can be expected to reduce his prostate volume by 30%.  This would decrease his risk for rectal toxicity.  For many reasons, including his young age I do favor robotic surgery over radiation therapy.  He understands that radiation therapy may be needed postoperatively depending on his pathologic stage and PSA determinations.  PLAN: As discussed above.  Dr. Alyson Ingles will make arrangements for him to proceed with robotic surgery.  I spent 45  minutes face to face with the patient and more than 50% of that time was spent in counseling and/or coordination of care.

## 2015-02-16 ENCOUNTER — Ambulatory Visit (HOSPITAL_COMMUNITY): Payer: No Typology Code available for payment source

## 2015-02-19 ENCOUNTER — Other Ambulatory Visit: Payer: Self-pay | Admitting: Urology

## 2015-02-20 ENCOUNTER — Other Ambulatory Visit: Payer: Self-pay | Admitting: Urology

## 2015-03-04 NOTE — Patient Instructions (Addendum)
Gerald Wolfe  03/04/2015   Your procedure is scheduled on: 03-11-15  Report to [facility] Main  Entrance take [facility]  elevators to 3rd floor to  [place] at 1030 AM.  Call this number if you have problems the morning of surgery [phone]   Remember: ONLY 1 PERSON MAY GO WITH YOU TO SHORT STAY TO GET  READY MORNING OF [place].  Do not eat food :After Midnight Monday night clear liquids all day Tuesday 03-10-15, no clear liquids after midnight Tuesday night. Follow all bowel prep instructions from dr Tresa Moore.   CLEAR LIQUID DIET   Foods Allowed                                                                     Foods Excluded  Coffee and tea, regular and decaf                             liquids that you cannot  Plain Jell-O in any flavor                                             see through such as: Fruit ices (not with fruit pulp)                                     milk, soups, orange juice  Iced Popsicles                                    All solid food Carbonated beverages, regular and diet                                    Cranberry, grape and apple juices Sports drinks like Gatorade Lightly seasoned clear broth or consume(fat free) Sugar, honey syrup  Sample Menu Breakfast                                Lunch                                     Supper Cranberry juice                    Beef broth                            Chicken broth Jell-O                                     Grape juice  Apple juice Coffee or tea                        Jell-O                                      Popsicle                                                Coffee or tea                        Coffee or tea  _____________________________________________________________________       Take these medicines the morning of surgery with A SIP OF WATER: METOPROLOL SUCCINATE, FLONASE NASAL SPRAY, ALPRAZOLAM (XANAX) IF NEEDED              You may not have any metal on your body including hair pins and              piercings  Do not wear jewelry, make-up, lotions, powders or perfumes, deodorant             Do not wear nail polish.  Do not shave  48 hours prior to surgery.              Men may shave face and neck.   Do not bring valuables to the hospital. [place].  Contacts, dentures or bridgework may not be worn into surgery.  Leave suitcase in the car. After surgery it may be brought to your room.                  Please read over the following fact sheets you were given: _____________________________________________________________________             [facility] - Preparing for Surgery Before surgery, you can play an important role.  Because skin is not sterile, your skin needs to be as free of germs as possible.  You can reduce the number of germs on your skin by washing with CHG (chlorahexidine gluconate) soap before surgery.  CHG is an antiseptic cleaner which kills germs and bonds with the skin to continue killing germs even after washing. Please DO NOT use if you have an allergy to CHG or antibacterial soaps.  If your skin becomes reddened/irritated stop using the CHG and inform your nurse when you arrive at Short Stay. Do not shave (including legs and underarms) for at least 48 hours prior to the first CHG shower.  You may shave your face/neck. Please follow these instructions carefully:  1.  Shower with CHG Soap the night before surgery and the  morning of Surgery.  2.  If you choose to wash your hair, wash your hair first as usual with your  normal  shampoo.  3.  After you shampoo, rinse your hair and body thoroughly to remove the  shampoo.                           4.  Use CHG as you would any other liquid soap.  You can apply chg directly  to the skin and wash                       Gently with a scrungie or clean washcloth.  5.  Apply the CHG Soap to  your body ONLY FROM THE NECK DOWN.   Do not use on face/ open                           Wound or open sores. Avoid contact with eyes, ears mouth and genitals (private parts).                       Wash face,  Genitals (private parts) with your normal soap.             6.  Wash thoroughly, paying special attention to the area where your surgery  will be performed.  7.  Thoroughly rinse your body with warm water from the neck down.  8.  DO NOT shower/wash with your normal soap after using and rinsing off  the CHG Soap.                9.  Pat yourself dry with a clean towel.            10.  Wear clean pajamas.            11.  Place clean sheets on your bed the night of your first shower and do not  sleep with pets. Day of Surgery : Do not apply any lotions/deodorants the morning of surgery.  Please wear clean clothes to the hospital/surgery center.  FAILURE TO FOLLOW THESE INSTRUCTIONS MAY RESULT IN THE CANCELLATION OF YOUR SURGERY PATIENT SIGNATURE_________________________________  NURSE SIGNATURE__________________________________  ________________________________________________________________________  WHAT IS A BLOOD TRANSFUSION? Blood Transfusion Information  A transfusion is the replacement of blood or some of its parts. Blood is made up of multiple cells which provide different functions.  Red blood cells carry oxygen and are used for blood loss replacement.  White blood cells fight against infection.  Platelets control bleeding.  Plasma helps clot blood.  Other blood products are available for specialized needs, such as hemophilia or other clotting disorders. BEFORE THE TRANSFUSION  Who gives blood for transfusions?   Healthy volunteers who are fully evaluated to make sure their blood is safe. This is blood bank blood. Transfusion therapy is the safest it has ever been in the practice of medicine. Before blood is taken from a donor, a complete history is taken to make sure  that person has no history of diseases nor engages in risky social behavior (examples are intravenous drug use or sexual activity with multiple partners). The donor's travel history is screened to minimize risk of transmitting infections, such as malaria. The donated blood is tested for signs of infectious diseases, such as HIV and hepatitis. The blood is then tested to be sure it is compatible with you in order to minimize the chance of a transfusion reaction. If you or a relative donates blood, this is often done in anticipation of surgery and is not appropriate for emergency situations. It takes many days to process the donated blood. RISKS AND COMPLICATIONS Although transfusion therapy is very safe and saves many lives, the main dangers of transfusion include:   Getting an infectious disease.  Developing a transfusion reaction. This is an allergic reaction to something in the blood you were given. Every precaution is taken to  prevent this. The decision to have a blood transfusion has been considered carefully by your caregiver before blood is given. Blood is not given unless the benefits outweigh the risks. AFTER THE TRANSFUSION  Right after receiving a blood transfusion, you will usually feel much better and more energetic. This is especially true if your red blood cells have gotten low (anemic). The transfusion raises the level of the red blood cells which carry oxygen, and this usually causes an energy increase.  The nurse administering the transfusion will monitor you carefully for complications. HOME CARE INSTRUCTIONS  No special instructions are needed after a transfusion. You may find your energy is better. Speak with your caregiver about any limitations on activity for underlying diseases you may have. SEEK MEDICAL CARE IF:   Your condition is not improving after your transfusion.  You develop redness or irritation at the intravenous (IV) site. SEEK IMMEDIATE MEDICAL CARE IF:  Any of  the following symptoms occur over the next 12 hours:  Shaking chills.  You have a temperature by mouth above 102 F (38.9 C), not controlled by medicine.  Chest, back, or muscle pain.  People around you feel you are not acting correctly or are confused.  Shortness of breath or difficulty breathing.  Dizziness and fainting.  You get a rash or develop hives.  You have a decrease in urine output.  Your urine turns a dark color or changes to pink, red, or brown. Any of the following symptoms occur over the next 10 days:  You have a temperature by mouth above 102 F (38.9 C), not controlled by medicine.  Shortness of breath.  Weakness after normal activity.  The white part of the eye turns yellow (jaundice).  You have a decrease in the amount of urine or are urinating less often.  Your urine turns a dark color or changes to pink, red, or brown. Document Released: 03/04/2000 Document Revised: 05/30/2011 Document Reviewed: 10/22/2007 [facility] Patient Information 2014 [place], [place].  _______________________________________________________________________

## 2015-03-04 NOTE — Progress Notes (Signed)
EKG 01-23-15 EPIC

## 2015-03-06 ENCOUNTER — Encounter (HOSPITAL_COMMUNITY)
Admission: RE | Admit: 2015-03-06 | Discharge: 2015-03-06 | Disposition: A | Discharge status: Home / Self Care | Payer: BLUE CROSS/BLUE SHIELD | Source: Ambulatory Visit | Attending: Urology | Admitting: Urology

## 2015-03-06 ENCOUNTER — Encounter (HOSPITAL_COMMUNITY): Payer: Self-pay

## 2015-03-06 DIAGNOSIS — Z01812 Encounter for preprocedural laboratory examination: Secondary | ICD-10-CM | POA: Diagnosis not present

## 2015-03-06 DIAGNOSIS — C61 Malignant neoplasm of prostate: Secondary | ICD-10-CM | POA: Insufficient documentation

## 2015-03-06 HISTORY — DX: Family history of other specified conditions: Z84.89

## 2015-03-06 LAB — CBC
HCT: 43 % (ref 39.0–52.0)
Hemoglobin: 14.9 g/dL (ref 13.0–17.0)
MCH: 30.5 pg (ref 26.0–34.0)
MCHC: 34.7 g/dL (ref 30.0–36.0)
MCV: 87.9 fL (ref 78.0–100.0)
PLATELETS: 186 10*3/uL (ref 150–400)
RBC: 4.89 MIL/uL (ref 4.22–5.81)
RDW: 13.1 % (ref 11.5–15.5)
WBC: 4.6 10*3/uL (ref 4.0–10.5)

## 2015-03-06 LAB — BASIC METABOLIC PANEL
Anion gap: 8 (ref 5–15)
BUN: 18 mg/dL (ref 6–20)
CO2: 29 mmol/L (ref 22–32)
CREATININE: 1 mg/dL (ref 0.61–1.24)
Calcium: 9.3 mg/dL (ref 8.9–10.3)
Chloride: 104 mmol/L (ref 101–111)
GFR calc Af Amer: >60 mL/min (ref >60)
GLUCOSE: 115 mg/dL — AB (ref 65–99)
POTASSIUM: 4 mmol/L (ref 3.5–5.1)
Sodium: 141 mmol/L (ref 135–145)

## 2015-03-06 LAB — ABO/RH: ABO/RH(D): A NEG

## 2015-03-11 ENCOUNTER — Inpatient Hospital Stay (HOSPITAL_COMMUNITY): Payer: BLUE CROSS/BLUE SHIELD | Admitting: Anesthesiology

## 2015-03-11 ENCOUNTER — Encounter (HOSPITAL_COMMUNITY): Payer: Self-pay | Admitting: *Deleted

## 2015-03-11 ENCOUNTER — Inpatient Hospital Stay (HOSPITAL_COMMUNITY)
Admission: RE | Admit: 2015-03-11 | Discharge: 2015-03-12 | DRG: 708 | Disposition: A | Discharge status: Home / Self Care | Payer: BLUE CROSS/BLUE SHIELD | Source: Ambulatory Visit | Attending: Urology | Admitting: Urology

## 2015-03-11 ENCOUNTER — Encounter (HOSPITAL_COMMUNITY)
Admission: RE | Disposition: A | Discharge status: Home / Self Care | Payer: Self-pay | Source: Ambulatory Visit | Attending: Urology

## 2015-03-11 DIAGNOSIS — C61 Malignant neoplasm of prostate: Principal | ICD-10-CM | POA: Diagnosis present

## 2015-03-11 DIAGNOSIS — Z809 Family history of malignant neoplasm, unspecified: Secondary | ICD-10-CM | POA: Diagnosis not present

## 2015-03-11 DIAGNOSIS — Z87891 Personal history of nicotine dependence: Secondary | ICD-10-CM | POA: Diagnosis not present

## 2015-03-11 DIAGNOSIS — I10 Essential (primary) hypertension: Secondary | ICD-10-CM | POA: Diagnosis present

## 2015-03-11 DIAGNOSIS — Z01812 Encounter for preprocedural laboratory examination: Secondary | ICD-10-CM

## 2015-03-11 HISTORY — PX: LYMPHADENECTOMY: SHX5960

## 2015-03-11 HISTORY — PX: ROBOT ASSISTED LAPAROSCOPIC RADICAL PROSTATECTOMY: SHX5141

## 2015-03-11 LAB — HEMOGLOBIN AND HEMATOCRIT, BLOOD
HCT: 37.6 % — ABNORMAL LOW (ref 39.0–52.0)
HEMOGLOBIN: 13 g/dL (ref 13.0–17.0)

## 2015-03-11 LAB — TYPE AND SCREEN
ABO/RH(D): A NEG
Antibody Screen: NEGATIVE

## 2015-03-11 SURGERY — ROBOTIC ASSISTED LAPAROSCOPIC RADICAL PROSTATECTOMY
Anesthesia: General

## 2015-03-11 MED ORDER — CLINDAMYCIN PHOSPHATE 900 MG/50ML IV SOLN
900.0000 mg | INTRAVENOUS | Status: AC
  Administered 2015-03-11: 900 mg via INTRAVENOUS

## 2015-03-11 MED ORDER — SODIUM CHLORIDE 0.9 % IV BOLUS (SEPSIS)
1000.0000 mL | Freq: Once | INTRAVENOUS | Status: AC
  Administered 2015-03-11: 1000 mL via INTRAVENOUS

## 2015-03-11 MED ORDER — HYDROMORPHONE HCL 1 MG/ML IJ SOLN
0.5000 mg | INTRAMUSCULAR | Status: DC | PRN
  Administered 2015-03-11 – 2015-03-12 (×6): 1 mg via INTRAVENOUS
  Filled 2015-03-11 (×6): qty 1

## 2015-03-11 MED ORDER — ROCURONIUM BROMIDE 100 MG/10ML IV SOLN
INTRAVENOUS | Status: AC
  Filled 2015-03-11: qty 1

## 2015-03-11 MED ORDER — ACETAMINOPHEN 500 MG PO TABS
1000.0000 mg | ORAL_TABLET | Freq: Four times a day (QID) | ORAL | Status: AC
  Administered 2015-03-11 – 2015-03-12 (×4): 1000 mg via ORAL
  Filled 2015-03-11 (×5): qty 2

## 2015-03-11 MED ORDER — DEXAMETHASONE SODIUM PHOSPHATE 10 MG/ML IJ SOLN
INTRAMUSCULAR | Status: DC | PRN
  Administered 2015-03-11: 10 mg via INTRAVENOUS

## 2015-03-11 MED ORDER — CLINDAMYCIN PHOSPHATE 900 MG/50ML IV SOLN
INTRAVENOUS | Status: AC
  Filled 2015-03-11: qty 50

## 2015-03-11 MED ORDER — FENTANYL CITRATE (PF) 100 MCG/2ML IJ SOLN
INTRAMUSCULAR | Status: AC
  Filled 2015-03-11: qty 2

## 2015-03-11 MED ORDER — CIPROFLOXACIN IN D5W 400 MG/200ML IV SOLN
400.0000 mg | INTRAVENOUS | Status: AC
  Administered 2015-03-11: 400 mg via INTRAVENOUS

## 2015-03-11 MED ORDER — LACTATED RINGERS IR SOLN
Status: DC | PRN
  Administered 2015-03-11: 1000 mL

## 2015-03-11 MED ORDER — ONDANSETRON HCL 4 MG/2ML IJ SOLN
INTRAMUSCULAR | Status: DC | PRN
  Administered 2015-03-11: 4 mg via INTRAVENOUS

## 2015-03-11 MED ORDER — HYDROCODONE-ACETAMINOPHEN 5-325 MG PO TABS: 1.0000 | ORAL_TABLET | Freq: Four times a day (QID) | ORAL | Status: DC | PRN

## 2015-03-11 MED ORDER — GLYCOPYRROLATE 0.2 MG/ML IJ SOLN
INTRAMUSCULAR | Status: AC
  Filled 2015-03-11: qty 3

## 2015-03-11 MED ORDER — INDOCYANINE GREEN 25 MG IV SOLR
INTRAVENOUS | Status: DC | PRN
  Administered 2015-03-11: 1.25 mg

## 2015-03-11 MED ORDER — PROMETHAZINE HCL 25 MG/ML IJ SOLN: 6.2500 mg | INTRAMUSCULAR | Status: DC | PRN

## 2015-03-11 MED ORDER — LISINOPRIL 20 MG PO TABS
20.0000 mg | ORAL_TABLET | Freq: Every day | ORAL | Status: DC
  Administered 2015-03-11 – 2015-03-12 (×2): 20 mg via ORAL
  Filled 2015-03-11 (×2): qty 1

## 2015-03-11 MED ORDER — HYDROCODONE-ACETAMINOPHEN 7.5-325 MG PO TABS: 1.0000 | ORAL_TABLET | Freq: Once | ORAL | Status: DC | PRN

## 2015-03-11 MED ORDER — PROPOFOL 10 MG/ML IV BOLUS
INTRAVENOUS | Status: DC | PRN
  Administered 2015-03-11: 200 mg via INTRAVENOUS

## 2015-03-11 MED ORDER — GLYCOPYRROLATE 0.2 MG/ML IJ SOLN
INTRAMUSCULAR | Status: DC | PRN
  Administered 2015-03-11: 0.6 mg via INTRAVENOUS

## 2015-03-11 MED ORDER — MIDAZOLAM HCL 5 MG/5ML IJ SOLN
INTRAMUSCULAR | Status: DC | PRN
  Administered 2015-03-11: 2 mg via INTRAVENOUS

## 2015-03-11 MED ORDER — DIPHENHYDRAMINE HCL 12.5 MG/5ML PO ELIX: 12.5000 mg | ORAL_SOLUTION | Freq: Four times a day (QID) | ORAL | Status: DC | PRN

## 2015-03-11 MED ORDER — STERILE WATER FOR IRRIGATION IR SOLN
Status: DC | PRN
Start: 2015-03-11 — End: 2015-03-11
  Administered 2015-03-11: 1000 mL

## 2015-03-11 MED ORDER — METOPROLOL SUCCINATE ER 50 MG PO TB24
50.0000 mg | ORAL_TABLET | Freq: Every day | ORAL | Status: DC
  Administered 2015-03-12: 50 mg via ORAL
  Filled 2015-03-11 (×2): qty 1

## 2015-03-11 MED ORDER — LACTATED RINGERS IV SOLN
INTRAVENOUS | Status: DC | PRN
  Administered 2015-03-11 (×2): via INTRAVENOUS

## 2015-03-11 MED ORDER — NEOSTIGMINE METHYLSULFATE 10 MG/10ML IV SOLN
INTRAVENOUS | Status: AC
  Filled 2015-03-11: qty 1

## 2015-03-11 MED ORDER — HYDROMORPHONE HCL 1 MG/ML IJ SOLN
0.2500 mg | INTRAMUSCULAR | Status: DC | PRN
  Administered 2015-03-11 (×4): 0.5 mg via INTRAVENOUS

## 2015-03-11 MED ORDER — LISINOPRIL-HYDROCHLOROTHIAZIDE 20-25 MG PO TABS: 1.0000 | ORAL_TABLET | Freq: Every day | ORAL | Status: DC

## 2015-03-11 MED ORDER — MIDAZOLAM HCL 2 MG/2ML IJ SOLN
INTRAMUSCULAR | Status: AC
Start: 2015-03-11 — End: 2015-03-11
  Filled 2015-03-11: qty 2

## 2015-03-11 MED ORDER — ONDANSETRON HCL 4 MG/2ML IJ SOLN
4.0000 mg | INTRAMUSCULAR | Status: DC | PRN
  Administered 2015-03-11: 4 mg via INTRAVENOUS
  Filled 2015-03-11: qty 2

## 2015-03-11 MED ORDER — ALPRAZOLAM 0.5 MG PO TABS: 0.5000 mg | ORAL_TABLET | Freq: Every evening | ORAL | Status: DC | PRN

## 2015-03-11 MED ORDER — SODIUM CHLORIDE 0.9 % IJ SOLN
INTRAMUSCULAR | Status: DC | PRN
  Administered 2015-03-11: 20 mL

## 2015-03-11 MED ORDER — HYDROCHLOROTHIAZIDE 25 MG PO TABS
25.0000 mg | ORAL_TABLET | Freq: Every day | ORAL | Status: DC
  Administered 2015-03-11 – 2015-03-12 (×2): 25 mg via ORAL
  Filled 2015-03-11 (×2): qty 1

## 2015-03-11 MED ORDER — LIDOCAINE HCL (CARDIAC) 20 MG/ML IV SOLN
INTRAVENOUS | Status: AC
  Filled 2015-03-11: qty 5

## 2015-03-11 MED ORDER — SODIUM CHLORIDE 0.9 % IJ SOLN
INTRAMUSCULAR | Status: AC
  Filled 2015-03-11: qty 20

## 2015-03-11 MED ORDER — OXYCODONE HCL 5 MG PO TABS
5.0000 mg | ORAL_TABLET | ORAL | Status: DC | PRN
  Administered 2015-03-12 (×2): 5 mg via ORAL
  Filled 2015-03-11 (×2): qty 1

## 2015-03-11 MED ORDER — PROPOFOL 10 MG/ML IV BOLUS
INTRAVENOUS | Status: AC
  Filled 2015-03-11: qty 20

## 2015-03-11 MED ORDER — FENTANYL CITRATE (PF) 250 MCG/5ML IJ SOLN
INTRAMUSCULAR | Status: DC | PRN
  Administered 2015-03-11 (×5): 50 ug via INTRAVENOUS
  Administered 2015-03-11: 100 ug via INTRAVENOUS

## 2015-03-11 MED ORDER — FENTANYL CITRATE (PF) 250 MCG/5ML IJ SOLN
INTRAMUSCULAR | Status: AC
  Filled 2015-03-11: qty 5

## 2015-03-11 MED ORDER — DEXAMETHASONE SODIUM PHOSPHATE 10 MG/ML IJ SOLN
INTRAMUSCULAR | Status: AC
  Filled 2015-03-11: qty 1

## 2015-03-11 MED ORDER — DIPHENHYDRAMINE HCL 50 MG/ML IJ SOLN: 12.5000 mg | Freq: Four times a day (QID) | INTRAMUSCULAR | Status: DC | PRN

## 2015-03-11 MED ORDER — BUPIVACAINE LIPOSOME 1.3 % IJ SUSP
20.0000 mL | Freq: Once | INTRAMUSCULAR | Status: AC
  Administered 2015-03-11: 20 mL
  Filled 2015-03-11: qty 20

## 2015-03-11 MED ORDER — HYDROMORPHONE HCL 1 MG/ML IJ SOLN
INTRAMUSCULAR | Status: AC
  Filled 2015-03-11: qty 1

## 2015-03-11 MED ORDER — CIPROFLOXACIN IN D5W 400 MG/200ML IV SOLN
INTRAVENOUS | Status: AC
  Filled 2015-03-11: qty 200

## 2015-03-11 MED ORDER — NEOSTIGMINE METHYLSULFATE 10 MG/10ML IV SOLN
INTRAVENOUS | Status: DC | PRN
  Administered 2015-03-11: 4 mg via INTRAVENOUS

## 2015-03-11 MED ORDER — LACTATED RINGERS IV SOLN
INTRAVENOUS | Status: DC
  Administered 2015-03-11: 16:00:00 via INTRAVENOUS

## 2015-03-11 MED ORDER — SUCCINYLCHOLINE CHLORIDE 20 MG/ML IJ SOLN
INTRAMUSCULAR | Status: DC | PRN
  Administered 2015-03-11: 100 mg via INTRAVENOUS

## 2015-03-11 MED ORDER — ONDANSETRON HCL 4 MG/2ML IJ SOLN
INTRAMUSCULAR | Status: AC
  Filled 2015-03-11: qty 2

## 2015-03-11 MED ORDER — ROCURONIUM BROMIDE 100 MG/10ML IV SOLN
INTRAVENOUS | Status: DC | PRN
  Administered 2015-03-11 (×3): 10 mg via INTRAVENOUS
  Administered 2015-03-11: 20 mg via INTRAVENOUS
  Administered 2015-03-11: 40 mg via INTRAVENOUS

## 2015-03-11 MED ORDER — SULFAMETHOXAZOLE-TRIMETHOPRIM 800-160 MG PO TABS: 1.0000 | ORAL_TABLET | Freq: Two times a day (BID) | ORAL | Status: DC

## 2015-03-11 MED ORDER — EPHEDRINE SULFATE 50 MG/ML IJ SOLN
INTRAMUSCULAR | Status: DC | PRN
  Administered 2015-03-11: 10 mg via INTRAVENOUS

## 2015-03-11 MED ORDER — DEXTROSE-NACL 5-0.45 % IV SOLN
INTRAVENOUS | Status: DC
  Administered 2015-03-11 – 2015-03-12 (×2): via INTRAVENOUS

## 2015-03-11 MED ORDER — LIDOCAINE HCL (CARDIAC) 20 MG/ML IV SOLN
INTRAVENOUS | Status: DC | PRN
  Administered 2015-03-11: 100 mg via INTRATRACHEAL

## 2015-03-11 SURGICAL SUPPLY — 53 items
CABLE HIGH FREQUENCY MONO STRZ (ELECTRODE) ×3 IMPLANT
CATH FOLEY 2WAY SLVR 18FR 30CC (CATHETERS) ×3 IMPLANT
CATH TIEMANN FOLEY 18FR 5CC (CATHETERS) ×3 IMPLANT
CHLORAPREP W/TINT 26ML (MISCELLANEOUS) ×3 IMPLANT
CLIP LIGATING HEM O LOK PURPLE (MISCELLANEOUS) ×9 IMPLANT
CLOTH BEACON ORANGE TIMEOUT ST (SAFETY) ×3 IMPLANT
CONT SPEC 4OZ CLIKSEAL STRL BL (MISCELLANEOUS) ×3 IMPLANT
COVER SURGICAL LIGHT HANDLE (MISCELLANEOUS) ×3 IMPLANT
COVER TIP SHEARS 8 DVNC (MISCELLANEOUS) ×2 IMPLANT
COVER TIP SHEARS 8MM DA VINCI (MISCELLANEOUS) ×1
CUTTER ECHEON FLEX ENDO 45 340 (ENDOMECHANICALS) ×3 IMPLANT
DECANTER SPIKE VIAL GLASS SM (MISCELLANEOUS) ×3 IMPLANT
DRSG TEGADERM 4X4.75 (GAUZE/BANDAGES/DRESSINGS) ×3 IMPLANT
DRSG TEGADERM 6X8 (GAUZE/BANDAGES/DRESSINGS) ×6 IMPLANT
ELECT REM PT RETURN 9FT ADLT (ELECTROSURGICAL) ×3
ELECTRODE REM PT RTRN 9FT ADLT (ELECTROSURGICAL) ×2 IMPLANT
GAUZE SPONGE 2X2 8PLY STRL LF (GAUZE/BANDAGES/DRESSINGS) ×2 IMPLANT
GLOVE BIO SURGEON STRL SZ 6.5 (GLOVE) ×3 IMPLANT
GLOVE BIOGEL M STRL SZ7.5 (GLOVE) ×27 IMPLANT
GLOVE BIOGEL PI IND STRL 7.5 (GLOVE) IMPLANT
GLOVE BIOGEL PI INDICATOR 7.5 (GLOVE)
GOWN STRL REUS W/TWL LRG LVL3 (GOWN DISPOSABLE) ×6 IMPLANT
GOWN STRL REUS W/TWL LRG LVL4 (GOWN DISPOSABLE) ×9 IMPLANT
HEMOSTAT SURGICEL 4X8 (HEMOSTASIS) ×3 IMPLANT
HOLDER FOLEY CATH W/STRAP (MISCELLANEOUS) ×3 IMPLANT
IV LACTATED RINGERS 1000ML (IV SOLUTION) ×3 IMPLANT
KIT PROCEDURE DA VINCI SI (MISCELLANEOUS) ×1
KIT PROCEDURE DVNC SI (MISCELLANEOUS) ×2 IMPLANT
LIQUID BAND (GAUZE/BANDAGES/DRESSINGS) ×3 IMPLANT
NEEDLE INSUFFLATION 14GA 120MM (NEEDLE) ×3 IMPLANT
NEEDLE SPNL 22GX7 QUINCKE BK (NEEDLE) ×3 IMPLANT
PACK ROBOT UROLOGY CUSTOM (CUSTOM PROCEDURE TRAY) ×3 IMPLANT
PAD POSITIONING PINK XL (MISCELLANEOUS) ×3 IMPLANT
RELOAD GREEN ECHELON 45 (STAPLE) ×3 IMPLANT
SET TUBE IRRIG SUCTION NO TIP (IRRIGATION / IRRIGATOR) ×3 IMPLANT
SHEET LAVH (DRAPES) ×3 IMPLANT
SOLUTION ELECTROLUBE (MISCELLANEOUS) ×3 IMPLANT
SPONGE GAUZE 2X2 STER 10/PKG (GAUZE/BANDAGES/DRESSINGS) ×1
SPONGE LAP 4X18 X RAY DECT (DISPOSABLE) ×3 IMPLANT
SUT ETHILON 3 0 PS 1 (SUTURE) ×3 IMPLANT
SUT MNCRL AB 4-0 PS2 18 (SUTURE) ×6 IMPLANT
SUT PDS AB 1 CT1 27 (SUTURE) ×6 IMPLANT
SUT VIC AB 0 CT1 36 (SUTURE) ×6 IMPLANT
SUT VIC AB 2-0 SH 27 (SUTURE) ×1
SUT VIC AB 2-0 SH 27X BRD (SUTURE) ×2 IMPLANT
SUT VICRYL 0 UR6 27IN ABS (SUTURE) ×3 IMPLANT
SUT VLOC BARB 180 ABS3/0GR12 (SUTURE) ×9
SUTURE VLOC BRB 180 ABS3/0GR12 (SUTURE) ×6 IMPLANT
SYR 27GX1/2 1ML LL SAFETY (SYRINGE) ×3 IMPLANT
TOWEL OR 17X26 10 PK STRL BLUE (TOWEL DISPOSABLE) ×3 IMPLANT
TOWEL OR NON WOVEN STRL DISP B (DISPOSABLE) ×3 IMPLANT
TROCAR 12M 150ML BLUNT (TROCAR) ×3 IMPLANT
WATER STERILE IRR 1500ML POUR (IV SOLUTION) IMPLANT

## 2015-03-11 NOTE — H&P (Signed)
Gerald Wolfe is an 59 y.o. male.    Chief Complaint: Pre-op Prostatectomy  HPI:   1 - Moderate risk prostate cancer in very large prostate - Gleason 3+4= 7 adenocarcinoma in LMA by biopsy 01/2015 on eval rising PSA 15.98. CT and bone scan clinically localized. TRUS 116 mL WITH MEDIAN LOBE. Piror BX 2010 benign and numerous equivocal serum and urine cancer-related assays.  PMH sig for foot surgery. His PCP is Gerald Jordan MD.  Today " Gerald Wolfe " is seen to proceed with prostatectomy.    Past Medical History  Diagnosis Date  . Hypertension   . Elevated PSA   . Anxiety   . Prostate cancer ([place]) 01/23/15    Prostate adenocarcinoma  . Family history of adverse reaction to anesthesia     younger sister does not metabolize anesthesia well    Past Surgical History  Procedure Laterality Date  . Foot surgery Left 2013    debridement/ removal spur  . Colonoscopy    . Prostate biopsy N/A 01/23/2015    Procedure: SATURATED BIOPSY TRANSRECTAL ULTRASONIC PROSTATE (TUBP);  Surgeon: Gerald Gustin, MD;  Location: [facility];  Service: Urology;  Laterality: N/A;    Family History  Problem Relation Age of Onset  . Cancer Father     lung   Social History:  reports that he quit smoking about 38 years ago. His smoking use included Cigarettes. He quit after 7 years of use. He has never used smokeless tobacco. He reports that he drinks about 3.0 - 4.2 oz of alcohol per week. He reports that he does not use illicit drugs.  Allergies:  Allergies  Allergen Reactions  . Penicillins Hives    Has patient had a PCN reaction causing immediate rash, facial/tongue/throat swelling, SOB or lightheadedness with hypotension: unknown Has patient had a PCN reaction causing severe rash involving mucus membranes or skin necrosis: no Has patient had a PCN reaction that required hospitalization: no Has patient had a PCN reaction occurring within the last 10 years: no If all of the above  answers are "NO", then may proceed with Cephalosporin use.     No prescriptions prior to admission    No results found for this or any previous visit (from the past 48 hour(s)). No results found.  Review of Systems  Constitutional: Negative.   HENT: Negative.   Eyes: Negative.   Respiratory: Negative.   Cardiovascular: Negative.   Gastrointestinal: Negative.   Genitourinary: Negative.   Skin: Negative.   Neurological: Negative.   Endo/Heme/Allergies: Negative.   Psychiatric/Behavioral: Negative.     There were no vitals taken for this visit. Physical Exam  Constitutional: He appears well-developed.  HENT:  Head: Normocephalic.  Eyes: Pupils are equal, round, and reactive to light.  Neck: Normal range of motion.  Cardiovascular: Normal rate.   Respiratory: Effort normal.  GI: Soft.  Genitourinary:  No CVAT  Musculoskeletal: Normal range of motion.  Neurological: He is alert.  Skin: Skin is warm and dry.  Psychiatric: He has a normal mood and affect. His behavior is normal. Judgment and thought content normal.     Assessment/Plan   1 - Moderate risk prostate cancer in very large prostate - agree surgery reasonable option in setting of significant malignancy in gland his size.   We rediscussed prostatectomy and specifically robotic prostatectomy with bilateral pelvic lymphadenectomy being the technique that I most commonly perform. I showed the patient on their abdomen the approximately 6 small incision (trocar) sites  as well as presumed extraction sites with robotic approach as well as possible open incision sites should open conversion be necessary. We rediscussed peri-operative risks including bleeding, infection, deep vein thrombosis, pulmonary embolism, compartment syndrome, nuropathy / neuropraxia, heart attack, stroke, death, as well as long-term risks such as non-cure / need for additional therapy. We specifically readdressed that the procedure would compromise  urinary control leading to stress incontinence which typically resolves with time and pelvic rehabilitation (Kegel's, etc..), but can sometimes be permanent and require additional therapy including surgery. We also specifically readdressed sexual sequellae including significant erectile dysfunction which typically partially resolves with time but can also be permanent and require additional therapy including surgery.   We rediscussed the typical hospital course including usual 1-2 night hospitalization, discharge with foley catheter in place usually for 1-2 weeks before voiding trial as well as usually 2 week recovery until able to perform most non-strenuous activity and 6 weeks until able to return to most jobs and more strenuous activity such as exercise.   He voiced understanding and desire to proceed today as planned.   Gerald Wolfe 03/11/2015, 6:57 AM

## 2015-03-11 NOTE — Anesthesia Preprocedure Evaluation (Signed)
Anesthesia Evaluation  Patient identified by MRN, date of birth, ID band Patient awake    Reviewed: Allergy & Precautions, H&P , NPO status , Patient's Chart, lab work & pertinent test results  History of Anesthesia Complications Negative for: history of anesthetic complications  Airway Mallampati: II  TM Distance: >3 FB Neck ROM: full    Dental no notable dental hx.    Pulmonary neg pulmonary ROS, former smoker,    Pulmonary exam normal breath sounds clear to auscultation       Cardiovascular hypertension, Pt. on medications Normal cardiovascular exam Rhythm:regular Rate:Normal     Neuro/Psych Anxiety negative neurological ROS     GI/Hepatic negative GI ROS, Neg liver ROS,   Endo/Other  negative endocrine ROS  Renal/GU negative Renal ROS     Musculoskeletal   Abdominal   Peds  Hematology negative hematology ROS (+)   Anesthesia Other Findings   Reproductive/Obstetrics negative OB ROS                             Anesthesia Physical Anesthesia Plan  ASA: III  Anesthesia Plan: General   Post-op Pain Management:    Induction: Intravenous  Airway Management Planned: Oral ETT  Additional Equipment:   Intra-op Plan:   Post-operative Plan: Extubation in OR  Informed Consent: I have reviewed the patients History and Physical, chart, labs and discussed the procedure including the risks, benefits and alternatives for the proposed anesthesia with the patient or authorized representative who has indicated his/her understanding and acceptance.   Dental Advisory Given  Plan Discussed with: Anesthesiologist and CRNA  Anesthesia Plan Comments:         Anesthesia Quick Evaluation

## 2015-03-11 NOTE — Discharge Instructions (Signed)

## 2015-03-11 NOTE — Transfer of Care (Signed)
Immediate Anesthesia Transfer of Care Note  Patient: Lenna Gilford  Procedure(s) Performed: Procedure(s): ROBOTIC ASSISTED LAPAROSCOPIC RADICAL PROSTATECTOMY WITH INDOCYANINE  GREEN DYE (N/A) LYMPHADENECTOMY (Bilateral)  Patient Location: PACU  Anesthesia Type:General  Level of Consciousness: awake, alert , oriented and patient cooperative  Airway & Oxygen Therapy: Patient Spontanous Breathing and Patient connected to face mask oxygen  Post-op Assessment: Report given to RN and Post -op Vital signs reviewed and stable  Post vital signs: Reviewed and stable  Last Vitals:  Filed Vitals:   03/11/15 1021  BP: 172/84  Pulse: 69  Temp: 36.4 C  Resp: 18    Complications: No apparent anesthesia complications

## 2015-03-11 NOTE — Anesthesia Postprocedure Evaluation (Signed)
Anesthesia Post Note  Patient: Gerald Wolfe  Procedure(s) Performed: Procedure(s) (LRB): ROBOTIC ASSISTED LAPAROSCOPIC RADICAL PROSTATECTOMY WITH INDOCYANINE  GREEN DYE (N/A) LYMPHADENECTOMY (Bilateral)  Patient location during evaluation: PACU Anesthesia Type: General Level of consciousness: awake and awake and alert Pain management: pain level controlled Respiratory status: spontaneous breathing Anesthetic complications: no    Last Vitals:  Filed Vitals:   03/11/15 1645 03/11/15 1707  BP: 154/90 151/85  Pulse: 70 72  Temp: 36.4 C 36.3 C  Resp: 12 16    Last Pain:  Filed Vitals:   03/11/15 1709  PainSc: Asleep                 Laney Bagshaw COKER

## 2015-03-11 NOTE — Brief Op Note (Signed)
03/11/2015  3:43 PM  PATIENT:  Lenna Gilford  59 y.o. male  PRE-OPERATIVE DIAGNOSIS:  PROSTATE CANCER IN LARGE PROSTATE  POST-OPERATIVE DIAGNOSIS:  PROSTATE CANCER IN LARGE PROSTATE  PROCEDURE:  Procedure(s): ROBOTIC ASSISTED LAPAROSCOPIC RADICAL PROSTATECTOMY WITH INDOCYANINE  GREEN DYE (N/A) LYMPHADENECTOMY (Bilateral)  SURGEON:  Surgeon(s) and Role:    * Alexis Frock, MD - Primary  PHYSICIAN ASSISTANT:   ASSISTANTS: Debbrah Alar, PA   ANESTHESIA:   local and general  EBL:  Total I/O In: 1900 [I.V.:1900] Out: 100 [Blood:100]  BLOOD ADMINISTERED:none  DRAINS: JP to bulb, Foley to gravity   LOCAL MEDICATIONS USED:  MARCAINE     SPECIMEN:  Source of Specimen:  radical prostatectomy, pelvic lymph nodes, peri-prostatic fat  DISPOSITION OF SPECIMEN:  PATHOLOGY  COUNTS:  YES  TOURNIQUET:  * No tourniquets in log *  DICTATION: .Other Dictation: Dictation Number [id number]  PLAN OF CARE: Admit to inpatient   PATIENT DISPOSITION:  PACU - hemodynamically stable.   Delay start of Pharmacological VTE agent (>24hrs) due to surgical blood loss or risk of bleeding: yes

## 2015-03-11 NOTE — Op Note (Signed)
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  PROCEDURES: 1. Robotic-assisted laparoscopic radical prostatectomy with bilateral     pelvic lymphangiectomy. 2. Injection of indocyanine green dye for sentinel lymphangiography. 3. Bilateral pelvic lymphangiectomy.  ESTIMATED BLOOD LOSS:  100 mL.  COMPLICATIONS:  None.  ASSISTANT: Debbrah Alar, PA  SPECIMENS: 1. Periprostatic fat. 2. Right external iliac lymph node, sentinel. 3. Right obturator lymph node. 4. Right perivesical lymph node, sentinel. 5. Left external iliac lymph nodes. 6. Left obturator lymph nodes. 7. Radical prostatectomy.  FINDINGS: 1. Hyperfluorescent lymphatic channels and lymph nodes, this is in the     right external iliac and the right perivesical area was very close     to the internal iliac group. 2. Large prostate with moderate median lobe as anticipated.  DRAINS: 1. Jackson-Pratt drain to bulb section. 2. Foley catheter to straight drain.  INDICATION:  Gerald Wolfe is a very pleasant 59 year old gentleman with long history of elevated PSA, status post multiple biopsies, multiple adjunctive laboratory studies and imaging studies, was found to have Gleason 7 adenocarcinoma by his most recent biopsy and evaluation of PSA that had risen to greater than 15.  He underwent staging imaging, which did not reveal any obvious distant disease.  Options were discussed for management including surgical therapies versus ablative therapies versus surveillance protocols and he had consultation with radiation oncology as well.  He ultimately elected to undergo radical prostatectomy with minimally invasive  assistance.  Informed consent was obtained and placed in the medical record.  PROCEDURE IN DETAIL:  The patient being Gerald Wolfe, was verified. Procedure being radical prostate was confirmed.  Procedure was carried out.  Time-out was performed.  Intravenous antibiotics were administered.  General endotracheal anesthesia was introduced.  The patient was placed into a low lithotomy position.  His arms were tucked with gel rolls that were bony prominences.  He was further fashioned on the operative table using 3-inch tape over foam padding across his chest.  A test of steep Trendelenburg positioning was performed and he was found to be suitably positioned.  Next, sterile field was created by first clipper shaving and then prepping and draping the patient's infra- xiphoid abdomen using chlorhexidine gluconate in his penis, perineum, and proximal thighs using iodine.  Foley catheter was placed per urethra to straight drain.  Next, a high-flow, low-pressure pneumoperitoneum was obtained using Veress technique in the supraumbilical midline having passed the aspiration and drop test.  An 8-mm robotic camera port was placed into this location.  Laparoscopic examination of the peritoneal cavity revealed no significant adhesions, no visceral injury. Additional ports were then placed as follows:  Left paramedian 8-mm robotic port, left far lateral 8-mm robotic port, right paramedian 8-mm robotic port, left far lateral 12-mm assistant port, right paramedian 5- mm suction port.  Robot was docked and passed through the  electronic checks.  Initial attention was directed at development of space of Retzius.  Incision was made lateral to the left medial umbilical ligament from the midline towards the area of the internal inguinal ring where the vas deferens was encountered, purposely ligated and used a bucket-handle for medial retraction and the bladder was carefully swept away from the sidewall  towards the area of the endopelvic fascia on the left side.  A mirror-imaged dissection was performed on the right, also purposely transecting the right vas deferens.  Anterior attachments were taken down using cautery scissors to expose the anterior base of the prostate, which was defatted to better appreciate the bladder neck- prostate junction, this fat was set aside, labeled periprostatic fat. Next, 0.2 mL of indocyanine green dye was injected into each lobe of the prostate using a percutaneously placed robotically-guided spinal with entrance just above the pubic ramus.  Suction was performed between injection cycles to prevent dye spillage, which did not occur.  Next, the endopelvic fascia was identified and swept away from the lateral aspect of the prostate in a base-to-apex orientation.  On the left side, there was a dominant artery and vein that were accessory entering the prostate and these were clipped, resulted in excellent hemostatic control of these structures.  The dorsal venous complex was then controlled using vascular load stapler taking great care to avoid membranous urethral injury, which did not occur and even approximately 15 minutes post injection and the pelvis was inspected under near- infrared fluorescence light.  Sentinel lymphangiography revealed several lymphatic channels coursing over the right side of the bladder and towards the right pelvic lymphatic field.  There were several fluorescent lymph nodes in the right external iliac as well as right perivesical nodes.  Attention was then directed on the lymphangiectomy on the right.  First, all fiber fatty tissue in the confines of right external iliac artery, vein, pelvic sidewall and iliac bifurcation were carefully mobilized. Lymphostasis was achieved with cold clips.  This was set aside and labeled the right external iliac lymph nodes, sentinel.  Next, all fiber fatty tissue in the confines of the right  external iliac vein, obturator nerve and pelvic side wall were carefully mobilized.  Lymphostasis was achieved with cold clips.  This was set aside, labeled right obturator lymph nodes.  Finally, a representative sample of the right perivesical lymph nodes were carefully mobilized.  Lymphostasis was achieved with cold clips and set aside and labeled right perivesical lymph node, sentinel.  On the left side, there were no obvious fluorescent lymph nodes and a template lymphangiectomy was performed at the left external iliac and left obturator groups respectively.    Attention was then directed to bladder neck dissection.  The bladder neck was identified removing the Foley catheter back and forth and a lateral release approach was taken dissecting the bladder neck away from the base of the prostate laterally towards the medial to make a smaller bladder neck. Dissection was then proceeded in anterior-posterior fashion keeping what appeared to be circular muscle fibers at each side of the specimen to avoid excessive bladder neck caliber and the distal bladder neck was purposely entered.  Foley catheter was used as a bucket handle for superior traction.  As expected, there was a moderate-sized median lobe, which was delivered via the bladder neck incision into the operative field.  A figure-of-eight was performed using Vicryl and used as a bucket handle to provide superior traction to the median lobe, so that the posterior aspect  of the bladder neck including a mucosa and muscle dissected away from the posterior base of the prostate, again, taking great care to avoid excessive bladder neck caliber, which did not occur. Posterior dissection was performed by incising approximately 8 mm inferior posterior to posterior lip of the prostate entering the plane of Denonvilliers.  Bilateral vas deferenses were encountered, dissected for distance approximately 3 cm, ligated and placed on gentle  superior traction.  Bilateral seminal vesicles were dissected to their tip, also placed on gentle superior traction.  This posterior plane was further developed towards the area of the apex of the prostate, which exposed the vascular pedicles on both sides.  First on the left side, the vascular pedicles were taken down using a sequential clipping technique, resulted in excellent hemostasis.  Moderately aggressive nerve sparing was then performed by sweeping the presumed neurovascular tissue away from the lateral aspect of the prostate from its midportion towards its apex keeping also a rim of fatty tissue with the prostatectomy specimen as well given his known left medial apex positivity.  A similar dissection was performed on the right side.  The right vascular pedicle and moderate right nerve sparing also performed as well.  Bladder neck dissection was performed in the anterior plane by placing the prostate in superior traction and transected the membranous urethra coldly keeping an adequate membranous urethral stump and also respecting the patient's known apical positivity.  This completely freed up the prostatectomy specimen, which was placed in an EndoCatch bag for later retrieval.  Next, digital rectal exam was performed using indicator glove under laparoscopic vision and no rectal violation was seen.  There was no blood on the glove.  Posterior dissection was performed using a running 3-0 V-Loc suture reapproximating the posterior urethral plate to the posterior bladder neck bringing these strictures in the tension-free apposition.  Mucosa-to-mucosa anastomosis was then performed using running V-Loc suture from the 6 o'clock to 12 o'clock position, which resulted in excellent apposition of the bladder neck and urethral stump. A new Foley catheter was placed, which irrigated quantitatively.  Given the moderately aggressive nerve sparing, there was some mild venous using on the left  side in the area of the neurovascular pedicle in effort to avoid direct cautery to this area.  Two strips of Surgicel were applied, which was then resulted in excellent hemostasis on the left neural pedicle.  At this point, all sponge and needle counts were correct.  Robot was then undocked.  A closed suction drain was brought through the previous left lateral most robotic port site to the area of the peritoneal cavity, taking great care to ensure that this was not in direct apposition to the anastomosis.  The previous right 12-mm assistant port site was closed at the level of the fascia using Minette Headland suture passer under laparoscopic vision.  Specimen was retrieved by extending the previous camera port site inferiorly for total distance approximately 4 cm, removing the prostatectomy specimens, setting aside for permanent pathology.  This extraction site was closed at the level of the fascia using figure-of-eight PDS x4 followed by reapproximation of the Scarpa's with running Vicryl.  All incision sites were infiltrated with dilute lyophilized Marcaine and closed at the level of the skin using subcuticular Monocryl followed by Dermabond. Drain stitch was also applied and procedure was terminated.  The patient tolerated the procedure well.  There were no immediate periprocedural complications.  The patient was taken to the postanesthesia care unit in stable condition.  ______________________________ Alexis Frock, MD     TM/MEDQ  D:  03/11/2015  T:  03/11/2015  Job:  [id number]

## 2015-03-11 NOTE — Anesthesia Procedure Notes (Signed)
Procedure Name: Intubation Date/Time: 03/11/2015 12:18 PM Performed by: Dione Booze Pre-anesthesia Checklist: Emergency Drugs available, Suction available, Patient being monitored and Patient identified Patient Re-evaluated:Patient Re-evaluated prior to inductionOxygen Delivery Method: Circle system utilized Preoxygenation: Pre-oxygenation with 100% oxygen Intubation Type: IV induction Laryngoscope Size: Mac and 4 Grade View: Grade II Tube type: Oral Tube size: 7.5 mm Number of attempts: 1 Airway Equipment and Method: Stylet Placement Confirmation: ETT inserted through vocal cords under direct vision and positive ETCO2 Secured at: 21 cm Tube secured with: Tape Dental Injury: Teeth and Oropharynx as per pre-operative assessment

## 2015-03-12 ENCOUNTER — Encounter (HOSPITAL_COMMUNITY): Payer: Self-pay | Admitting: Urology

## 2015-03-12 LAB — HEMOGLOBIN AND HEMATOCRIT, BLOOD
HEMATOCRIT: 37.1 % — AB (ref 39.0–52.0)
HEMOGLOBIN: 12.6 g/dL — AB (ref 13.0–17.0)

## 2015-03-12 LAB — BASIC METABOLIC PANEL
Anion gap: 10 (ref 5–15)
BUN: 22 mg/dL — ABNORMAL HIGH (ref 6–20)
CALCIUM: 8.2 mg/dL — AB (ref 8.9–10.3)
CO2: 26 mmol/L (ref 22–32)
CREATININE: 1.3 mg/dL — AB (ref 0.61–1.24)
Chloride: 102 mmol/L (ref 101–111)
GFR calc non Af Amer: 58 mL/min — ABNORMAL LOW (ref >60)
Glucose, Bld: 131 mg/dL — ABNORMAL HIGH (ref 65–99)
Potassium: 4.1 mmol/L (ref 3.5–5.1)
SODIUM: 138 mmol/L (ref 135–145)

## 2015-03-12 LAB — CREATININE, FLUID (PLEURAL, PERITONEAL, JP DRAINAGE): Creat, Fluid: 1.2 mg/dL

## 2015-03-12 MED ORDER — SENNOSIDES-DOCUSATE SODIUM 8.6-50 MG PO TABS
1.0000 | ORAL_TABLET | Freq: Two times a day (BID) | ORAL | Status: DC
  Administered 2015-03-12: 1 via ORAL
  Filled 2015-03-12: qty 1

## 2015-03-12 MED ORDER — SENNOSIDES-DOCUSATE SODIUM 8.6-50 MG PO TABS: 1.0000 | ORAL_TABLET | Freq: Two times a day (BID) | ORAL | Status: DC

## 2015-03-12 MED ORDER — OXYCODONE-ACETAMINOPHEN 5-325 MG PO TABS: 1.0000 | ORAL_TABLET | Freq: Four times a day (QID) | ORAL | Status: DC | PRN

## 2015-03-12 NOTE — Care Management Note (Signed)
Case Management Note  Patient Details  Name: KASEM HAFELE MRN: [id number] Date of Birth: [birth year]  Subjective/Objective:  59 y/o m admitted w/Prostate Ca. POD#1 Lap Rad Prostatectomy. From home.                  Action/Plan:d/c plan home.   Expected Discharge Date:                  Expected Discharge Plan:  Home/Self Care  In-House Referral:     Discharge planning Services  CM Consult  Post Acute Care Choice:    Choice offered to:     DME Arranged:    DME Agency:     HH Arranged:    HH Agency:     Status of Service:  In process, will continue to follow  Medicare Important Message Given:    Date Medicare IM Given:    Medicare IM give by:    Date Additional Medicare IM Given:    Additional Medicare Important Message give by:     If discussed at [place] of Stay Meetings, dates discussed:    Additional Comments:  Dessa Phi, RN 03/12/2015, 11:01 AM

## 2015-03-12 NOTE — Discharge Summary (Signed)
Physician Discharge Summary  Patient ID: Gerald Wolfe MRN: [id number] DOB/AGE: 08/18/1955 59 y.o.  Admit date: 03/11/2015 Discharge date: 03/12/2015  Admission Diagnoses: Prostate Cancer  Discharge Diagnoses:  Active Problems:   Prostate cancer [facility])   Discharged Condition: good  Hospital Course:  Pt underwent robotic prostatectomy with bilateral sentinal + template lymphadenectomy on 03/11/15, the day of admission, without acute complications. He was admitted to 4th floor Urology service post-op where he began his vigorous recovery. By POD1, the day of discharge, he is ambulatory, pain controlled on PO meds, tollerating PO diet and felt to be adequate for discharge. JP removed as Cr same as serum. Hgb at DC >10.   Consults: None  Significant Diagnostic Studies: labs: as per HPI, pathology pending at discharge  Treatments: surgery:  robotic prostatectomy with bilateral sentinal + template lymphadenectomy on 03/11/15  Discharge Exam: Blood pressure 133/62, pulse 62, temperature 97.8 F (36.6 C), temperature source Oral, resp. rate 15, height 6\' 3"  (1.905 m), weight 107.758 kg (237 lb 9 oz), SpO2 94 %. General appearance: alert, cooperative, appears stated age and family at bedside Head: Normocephalic, without obvious abnormality, atraumatic Eyes: negative Nose: Nares normal. Septum midline. Mucosa normal. No drainage or sinus tenderness. Throat: lips, mucosa, and tongue normal; teeth and gums normal Neck: supple, symmetrical, trachea midline Back: symmetric, no curvature. ROM normal. No CVA tenderness. Resp: non-labored on room air Cardio: Nl rate GI: soft, non-tender; bowel sounds normal; no masses,  no organomegaly Male genitalia: normal, foley c/d/i wtih clear urine.  Extremities: extremities normal, atraumatic, no cyanosis or edema Pulses: 2+ and symmetric Skin: Skin color, texture, turgor normal. No rashes or lesions Neurologic: Grossly normal Incision/Wound: Recent  port sites / extraction sites c/d/i. No hernias. JP removed and dry dressing applied.   Disposition: 01-Home or Self Care     Medication List    STOP taking these medications        multivitamin with minerals Tabs tablet     traMADol 50 MG tablet  Commonly known as:  ULTRAM     VIAGRA 100 MG tablet  Generic drug:  sildenafil      TAKE these medications        ALPRAZolam 0.5 MG tablet  Commonly known as:  XANAX  Take 0.5 mg by mouth at bedtime as needed for sleep.     fluticasone 50 MCG/ACT nasal spray  Commonly known as:  FLONASE  Place 1 spray into both nostrils daily as needed for allergies or rhinitis.     lisinopril-hydrochlorothiazide 20-25 MG tablet  Commonly known as:  PRINZIDE,ZESTORETIC  Take 1 tablet by mouth daily.     oxyCODONE-acetaminophen 5-325 MG tablet  Commonly known as:  ROXICET  Take 1-2 tablets by mouth every 6 (six) hours as needed for moderate pain or severe pain. Post-operatively     senna-docusate 8.6-50 MG tablet  Commonly known as:  Senokot-S  Take 1 tablet by mouth 2 (two) times daily. While taking pain meds to prevent constipation     sulfamethoxazole-trimethoprim 800-160 MG tablet  Commonly known as:  BACTRIM DS,SEPTRA DS  Take 1 tablet by mouth 2 (two) times daily. Start the day prior to foley removal appointment     TOPROL XL 50 MG 24 hr tablet  Generic drug:  metoprolol succinate  Take 50 mg by mouth daily. Take with or immediately following a meal.           Follow-up Information    Follow up with [facility], Frances Joynt,  MD On 03/19/2015.   Specialty:  Urology   Why:  at 1200 for MD visit and catheter removal. Dr. Tresa Moore will call you with pathology results piro.    Contact information:   [address] [place] [address] [phone]       Signed: Alexis Frock 03/12/2015, 4:52 PM

## 2015-04-27 ENCOUNTER — Encounter: Payer: BLUE CROSS/BLUE SHIELD | Attending: Family Medicine | Admitting: Dietician

## 2015-04-27 ENCOUNTER — Encounter: Payer: Self-pay | Admitting: Dietician

## 2015-04-27 VITALS — Ht 74.0 in | Wt 240.0 lb

## 2015-04-27 DIAGNOSIS — I10 Essential (primary) hypertension: Secondary | ICD-10-CM | POA: Insufficient documentation

## 2015-04-27 DIAGNOSIS — Z87891 Personal history of nicotine dependence: Secondary | ICD-10-CM | POA: Diagnosis not present

## 2015-04-27 DIAGNOSIS — G8929 Other chronic pain: Secondary | ICD-10-CM | POA: Diagnosis not present

## 2015-04-27 DIAGNOSIS — M545 Low back pain: Secondary | ICD-10-CM | POA: Insufficient documentation

## 2015-04-27 DIAGNOSIS — E7800 Pure hypercholesterolemia, unspecified: Secondary | ICD-10-CM | POA: Diagnosis not present

## 2015-04-27 DIAGNOSIS — E669 Obesity, unspecified: Secondary | ICD-10-CM | POA: Insufficient documentation

## 2015-04-27 DIAGNOSIS — Z6830 Body mass index (BMI) 30.0-30.9, adult: Secondary | ICD-10-CM

## 2015-04-27 DIAGNOSIS — Z6832 Body mass index (BMI) 32.0-32.9, adult: Secondary | ICD-10-CM | POA: Insufficient documentation

## 2015-04-27 DIAGNOSIS — F419 Anxiety disorder, unspecified: Secondary | ICD-10-CM | POA: Diagnosis not present

## 2015-04-27 NOTE — Patient Instructions (Signed)
Increase plant foods (vegetables and fruit) Choose frozen vegetables without salt instead of canned most often. Rinse beans Small amounts of protein with each meal and snack. Use Mrs. Dash or other herbs and spices rather than salt. Drink plenty of water. Limit processed meat. The decisions we make are first made in the grocery store.  Avoid shopping hungry. Be a be a mindful eater.  Stop when full.  Avoid getting too hungry.

## 2015-04-27 NOTE — Progress Notes (Signed)
Medical Nutrition Therapy:  Appt start time: 1400 end time:  1500.   Assessment:  Primary concerns today: Patient is here alone and would like to lose weight.  As well as have a less "acidic" diet.  History includes HTN, and prostate cancer with recent surgery.  He is also concerned about his increase in blood sugar although no diagnosis of diabetes at this time.  He has lost 5 lbs in the past 3 months but feels that additional support to lose would be helpful.  He followed a men's health abs diet in the past (5 small meals per day, approximately 2000 calories) but regained this.  UBW 235-238 without clothing.  He weighed 250 lbs in his late thirties, diagnosed with HTN, did not like medication and lost 37 lbs in 3-4 months.  He slowly regained the weight.  GFR noted at 58.  Patient lives with his girlfriend Caryl Pina for the past 19 years.  Both shop and cook.  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    TANITA  BODY COMP RESULTS 04/27/15 240 lbs   BMI (kg/m^2) 30.9   Fat Mass (lbs) 72.5   Fat Free Mass (lbs) 168   Total Body Water (lbs) 123    Preferred Learning Style:   No preference indicated   Learning Readiness:   Ready  Change in progress  MEDICATIONS: see list   DIETARY INTAKE:  Usual eating pattern includes 3 meals and 1-2 snacks per day. Avoided foods include many processed foods, rare soda, decreased added salt.  24-hr recall:  B (6 AM): eggs and sausage or [place] bacon, toast (at times) or biscuit OR fruit, steel cut oatmeal with 1-2 T maple syrup and 2% milk OR protein shake OR smoothie (1 c. Milk, 1 c. Strawberries, 4 T LF vanilla yogurt, 2 scoops protein powder) Snk (10 AM): apple or granola bar or protein bar OR cookies/cake L (11:30-2:00 PM): wrap or sandwich OR leftovers OR plate lunch OR salad Snk (3 PM): unsweetened fruit cup, almonds or peanuts D (7:30 PM): fish or chicken or beef, salad, canned beans, canned vegetables Snk (  PM): none, occasional popcorn or chips Beverages: coffee with cream, water, unsweetened tea, 6-8 vodka per week (grapefruit juice or cranberry juice, seltzer water).    Usual physical activity: Has just started to resume exercise regime post op and walks a lot at work. Every other day:  Treadmill and Elliptical  35-40 minutes and 1 hour of weights.  Estimated energy needs: 2000 calories 225 g carbohydrates 150 g protein 56 g fat  Progress Towards Goal(s):  In progress.   Nutritional Diagnosis:  NB-1.1 Food and nutrition-related knowledge deficit As related to balance of nutrients and energy expenditure.  As evidenced by patient report.    Intervention:  Nutrition counseling/education related to healthy eating for weight loss and the DASH diet for HTN.    Increase plant foods (vegetables and fruit) Choose frozen vegetables without salt instead of canned most often. Rinse beans Small amounts of protein with each meal and snack. Use Mrs. Dash or other herbs and spices rather than salt. Drink plenty of water. Limit processed meat. The decisions we make are first made in the grocery store.  Avoid shopping hungry. Be a be a mindful eater.  Stop when full.  Avoid getting too hungry.   Teaching Method Utilized:  Visual Auditory Hands on  Handouts given during visit include:  My plate  Snack list  HTN/Dash diet  Weight loss tips  Weight management resources  Yellow meal plan card  Barriers to learning/adherence to lifestyle change: none  Demonstrated degree of understanding via:  Teach Back   Monitoring/Evaluation:  Dietary intake, exercise, and body weight in 6 week(s).

## 2015-06-09 ENCOUNTER — Ambulatory Visit: Payer: BLUE CROSS/BLUE SHIELD | Admitting: Dietician

## 2015-07-07 DIAGNOSIS — C61 Malignant neoplasm of prostate: Secondary | ICD-10-CM | POA: Diagnosis not present

## 2015-07-07 DIAGNOSIS — I10 Essential (primary) hypertension: Secondary | ICD-10-CM | POA: Diagnosis not present

## 2015-07-07 DIAGNOSIS — Z0000 Encounter for general adult medical examination without abnormal findings: Secondary | ICD-10-CM | POA: Diagnosis not present

## 2015-07-07 DIAGNOSIS — R739 Hyperglycemia, unspecified: Secondary | ICD-10-CM | POA: Diagnosis not present

## 2015-07-13 ENCOUNTER — Encounter: Payer: BLUE CROSS/BLUE SHIELD | Attending: Family Medicine | Admitting: Dietician

## 2015-07-13 DIAGNOSIS — E7800 Pure hypercholesterolemia, unspecified: Secondary | ICD-10-CM | POA: Insufficient documentation

## 2015-07-13 DIAGNOSIS — I10 Essential (primary) hypertension: Secondary | ICD-10-CM | POA: Insufficient documentation

## 2015-07-13 DIAGNOSIS — Z87891 Personal history of nicotine dependence: Secondary | ICD-10-CM | POA: Insufficient documentation

## 2015-07-13 DIAGNOSIS — M545 Low back pain: Secondary | ICD-10-CM | POA: Insufficient documentation

## 2015-07-13 DIAGNOSIS — F419 Anxiety disorder, unspecified: Secondary | ICD-10-CM | POA: Insufficient documentation

## 2015-07-13 DIAGNOSIS — E669 Obesity, unspecified: Secondary | ICD-10-CM | POA: Insufficient documentation

## 2015-07-13 DIAGNOSIS — Z6832 Body mass index (BMI) 32.0-32.9, adult: Secondary | ICD-10-CM | POA: Insufficient documentation

## 2015-07-13 DIAGNOSIS — G8929 Other chronic pain: Secondary | ICD-10-CM | POA: Insufficient documentation

## 2015-07-13 NOTE — Progress Notes (Signed)
Medical Nutrition Therapy:  Appt start time: 1400 end time:  1500.   Assessment:  Primary concerns today: Patient is here alone and would like to lose weight.  As well as have a less "acidic" diet.  History includes HTN, and prostate cancer with recent surgery.  He is also concerned about his increase in blood sugar although no diagnosis of diabetes at this time.  He has lost 5 lbs in the past 3 months but feels that additional support to lose would be helpful.  He followed a men's health abs diet in the past (5 small meals per day, approximately 2000 calories) but regained this.  UBW 235-238 without clothing.  He weighed 250 lbs in his late thirties, diagnosed with HTN, did not like medication and lost 37 lbs in 3-4 months.  He slowly regained the weight.  GFR noted at 58.  Patient lives with his girlfriend Caryl Pina for the past 19 years.  Both shop and cook.  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    TANITA  BODY COMP RESULTS 04/27/15 240 lbs   BMI (kg/m^2) 30.9   Fat Mass (lbs) 72.5   Fat Free Mass (lbs) 168   Total Body Water (lbs) 123    Preferred Learning Style:   No preference indicated   Learning Readiness:   Ready  Change in progress  MEDICATIONS: see list   DIETARY INTAKE:  Usual eating pattern includes 3 meals and 1-2 snacks per day. Avoided foods include many processed foods, rare soda, decreased added salt.  24-hr recall:  B (6 AM): eggs and sausage or [place] bacon, toast (at times) or biscuit OR fruit, steel cut oatmeal with 1-2 T maple syrup and 2% milk OR protein shake OR smoothie (1 c. Milk, 1 c. Strawberries, 4 T LF vanilla yogurt, 2 scoops protein powder) Snk (10 AM): apple or granola bar or protein bar OR cookies/cake L (11:30-2:00 PM): wrap or sandwich OR leftovers OR plate lunch OR salad Snk (3 PM): unsweetened fruit cup, almonds or peanuts D (7:30 PM): fish or chicken or beef, salad, canned beans, canned vegetables Snk (  PM): none, occasional popcorn or chips Beverages: coffee with cream, water, unsweetened tea, 6-8 vodka per week (grapefruit juice or cranberry juice, seltzer water).    Usual physical activity: Has just started to resume exercise regime post op and walks a lot at work. Every other day:  Treadmill and Elliptical  35-40 minutes and 1 hour of weights.  Estimated energy needs: 2000 calories 225 g carbohydrates 150 g protein 56 g fat  Progress Towards Goal(s):  In progress.   Nutritional Diagnosis:  NB-1.1 Food and nutrition-related knowledge deficit As related to balance of nutrients and energy expenditure.  As evidenced by patient report.    Intervention:  Nutrition counseling/education related to healthy eating for weight loss and the DASH diet for HTN.    Increase plant foods (vegetables and fruit) Choose frozen vegetables without salt instead of canned most often. Rinse beans Small amounts of protein with each meal and snack. Use Mrs. Dash or other herbs and spices rather than salt. Drink plenty of water. Limit processed meat. The decisions we make are first made in the grocery store.  Avoid shopping hungry. Be a be a mindful eater.  Stop when full.  Avoid getting too hungry.   Teaching Method Utilized:  Visual Auditory Hands on  Handouts given during visit include:  My plate  Snack list  HTN/Dash diet  Weight loss tips  Weight management resources  Yellow meal plan card  Barriers to learning/adherence to lifestyle change: none  Demonstrated degree of understanding via:  Teach Back   Monitoring/Evaluation:  Dietary intake, exercise, and body weight in 6 week(s).

## 2015-08-26 DIAGNOSIS — C61 Malignant neoplasm of prostate: Secondary | ICD-10-CM | POA: Diagnosis not present

## 2015-09-03 DIAGNOSIS — C61 Malignant neoplasm of prostate: Secondary | ICD-10-CM | POA: Diagnosis not present

## 2015-09-03 DIAGNOSIS — N393 Stress incontinence (female) (male): Secondary | ICD-10-CM | POA: Diagnosis not present

## 2015-11-18 DIAGNOSIS — E876 Hypokalemia: Secondary | ICD-10-CM | POA: Diagnosis not present

## 2015-11-26 DIAGNOSIS — C61 Malignant neoplasm of prostate: Secondary | ICD-10-CM | POA: Diagnosis not present

## 2015-12-03 DIAGNOSIS — N5201 Erectile dysfunction due to arterial insufficiency: Secondary | ICD-10-CM | POA: Diagnosis not present

## 2015-12-22 DIAGNOSIS — Z23 Encounter for immunization: Secondary | ICD-10-CM | POA: Diagnosis not present

## 2016-01-25 DIAGNOSIS — Z9079 Acquired absence of other genital organ(s): Secondary | ICD-10-CM | POA: Diagnosis not present

## 2016-01-25 DIAGNOSIS — I10 Essential (primary) hypertension: Secondary | ICD-10-CM | POA: Diagnosis not present

## 2016-01-25 DIAGNOSIS — Z23 Encounter for immunization: Secondary | ICD-10-CM | POA: Diagnosis not present

## 2016-01-25 DIAGNOSIS — Z8546 Personal history of malignant neoplasm of prostate: Secondary | ICD-10-CM | POA: Diagnosis not present

## 2016-01-25 DIAGNOSIS — F411 Generalized anxiety disorder: Secondary | ICD-10-CM | POA: Diagnosis not present

## 2016-02-06 IMAGING — NM NM BONE WHOLE BODY
2 series · 2 of 2 positions shown · non-contrast
Comparison: None.

CLINICAL DATA: 59-year-old with recent diagnosis of prostate cancer
(PSA 15.98). Initial staging. Prior left foot surgery in 0971. No
bone pain. No recent falls or injuries.

EXAM:
NUCLEAR MEDICINE WHOLE BODY BONE SCAN
TECHNIQUE: Whole body anterior and posterior images were obtained approximately
3 hours after intravenous injection of radiopharmaceutical.
RADIOPHARMACEUTICALS:  27.5 mCi 3echnetium-33m MDP IV

[Series 1: whole body · 2.66mm/px · 1 of 1 slices shown (1 of 2)]
[im 1/1]
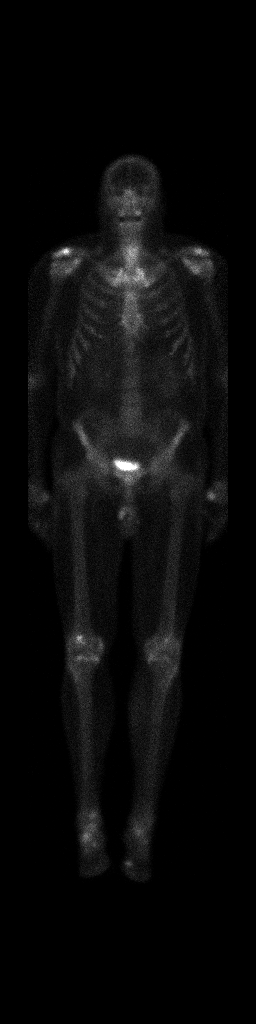

[Series 1: whole body · 2.66mm/px · 1 of 1 slices shown (2 of 2)]
[im 1/1]
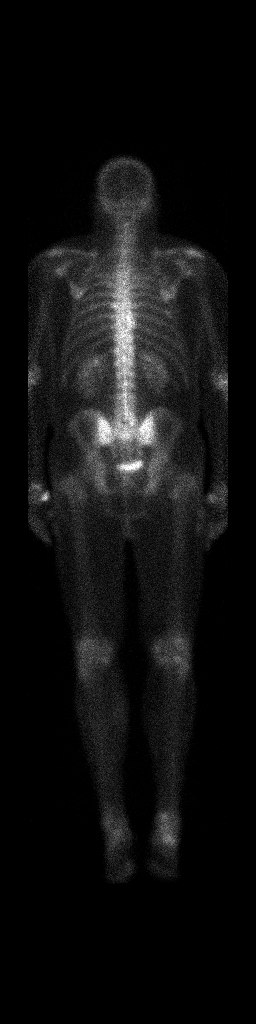

[2 of 2 positions shown; findings below may reference images not displayed]

FINDINGS: No abnormal osseous activity to suggest metastatic disease.
Degenerative activity involving multiple joints including the AC
joints bilaterally (right greater than left), the sternoclavicular
joints, the 1st MCP joints bilaterally (left greater than right),
both knees, the right hindfoot and midfoot, the left hindfoot, and
the left 1st MTP joint.

Physiologic excretion of radiopharmaceutical into the urinary tract.
IMPRESSION: 1. No evidence of osseous metastatic disease.
2. Degenerative activity as detailed above.

## 2016-03-15 DIAGNOSIS — M545 Low back pain: Secondary | ICD-10-CM | POA: Diagnosis not present

## 2016-03-15 DIAGNOSIS — G8929 Other chronic pain: Secondary | ICD-10-CM | POA: Diagnosis not present

## 2016-03-23 DIAGNOSIS — J0141 Acute recurrent pansinusitis: Secondary | ICD-10-CM | POA: Diagnosis not present

## 2016-05-19 DIAGNOSIS — C61 Malignant neoplasm of prostate: Secondary | ICD-10-CM | POA: Diagnosis not present

## 2016-05-26 DIAGNOSIS — C61 Malignant neoplasm of prostate: Secondary | ICD-10-CM | POA: Diagnosis not present

## 2016-05-26 DIAGNOSIS — N393 Stress incontinence (female) (male): Secondary | ICD-10-CM | POA: Diagnosis not present

## 2016-07-18 DIAGNOSIS — Z79899 Other long term (current) drug therapy: Secondary | ICD-10-CM | POA: Diagnosis not present

## 2016-07-18 DIAGNOSIS — E7800 Pure hypercholesterolemia, unspecified: Secondary | ICD-10-CM | POA: Diagnosis not present

## 2016-07-18 DIAGNOSIS — I10 Essential (primary) hypertension: Secondary | ICD-10-CM | POA: Diagnosis not present

## 2016-07-18 DIAGNOSIS — Z0000 Encounter for general adult medical examination without abnormal findings: Secondary | ICD-10-CM | POA: Diagnosis not present

## 2016-08-09 ENCOUNTER — Other Ambulatory Visit: Payer: Self-pay | Admitting: Dermatology

## 2016-08-09 DIAGNOSIS — D485 Neoplasm of uncertain behavior of skin: Secondary | ICD-10-CM | POA: Diagnosis not present

## 2016-08-09 DIAGNOSIS — L814 Other melanin hyperpigmentation: Secondary | ICD-10-CM | POA: Diagnosis not present

## 2016-08-09 DIAGNOSIS — D229 Melanocytic nevi, unspecified: Secondary | ICD-10-CM | POA: Diagnosis not present

## 2016-08-09 DIAGNOSIS — C44519 Basal cell carcinoma of skin of other part of trunk: Secondary | ICD-10-CM | POA: Diagnosis not present

## 2016-09-08 DIAGNOSIS — C44519 Basal cell carcinoma of skin of other part of trunk: Secondary | ICD-10-CM | POA: Diagnosis not present

## 2016-12-01 DIAGNOSIS — C61 Malignant neoplasm of prostate: Secondary | ICD-10-CM | POA: Diagnosis not present

## 2017-01-03 DIAGNOSIS — Z23 Encounter for immunization: Secondary | ICD-10-CM | POA: Diagnosis not present

## 2017-01-24 DIAGNOSIS — N5201 Erectile dysfunction due to arterial insufficiency: Secondary | ICD-10-CM | POA: Diagnosis not present

## 2017-01-24 DIAGNOSIS — N281 Cyst of kidney, acquired: Secondary | ICD-10-CM | POA: Diagnosis not present

## 2017-01-24 DIAGNOSIS — N393 Stress incontinence (female) (male): Secondary | ICD-10-CM | POA: Diagnosis not present

## 2017-01-24 DIAGNOSIS — C61 Malignant neoplasm of prostate: Secondary | ICD-10-CM | POA: Diagnosis not present

## 2017-04-28 DIAGNOSIS — B9689 Other specified bacterial agents as the cause of diseases classified elsewhere: Secondary | ICD-10-CM | POA: Diagnosis not present

## 2017-04-28 DIAGNOSIS — J019 Acute sinusitis, unspecified: Secondary | ICD-10-CM | POA: Diagnosis not present

## 2017-04-28 DIAGNOSIS — J22 Unspecified acute lower respiratory infection: Secondary | ICD-10-CM | POA: Diagnosis not present

## 2017-06-05 DIAGNOSIS — J3089 Other allergic rhinitis: Secondary | ICD-10-CM | POA: Diagnosis not present

## 2017-06-05 DIAGNOSIS — B9689 Other specified bacterial agents as the cause of diseases classified elsewhere: Secondary | ICD-10-CM | POA: Diagnosis not present

## 2017-06-05 DIAGNOSIS — M549 Dorsalgia, unspecified: Secondary | ICD-10-CM | POA: Diagnosis not present

## 2017-06-05 DIAGNOSIS — J019 Acute sinusitis, unspecified: Secondary | ICD-10-CM | POA: Diagnosis not present

## 2017-06-12 DIAGNOSIS — E7800 Pure hypercholesterolemia, unspecified: Secondary | ICD-10-CM | POA: Diagnosis not present

## 2017-06-12 DIAGNOSIS — G8929 Other chronic pain: Secondary | ICD-10-CM | POA: Diagnosis not present

## 2017-06-12 DIAGNOSIS — I10 Essential (primary) hypertension: Secondary | ICD-10-CM | POA: Diagnosis not present

## 2017-06-12 DIAGNOSIS — M549 Dorsalgia, unspecified: Secondary | ICD-10-CM | POA: Diagnosis not present

## 2017-08-07 DIAGNOSIS — I10 Essential (primary) hypertension: Secondary | ICD-10-CM | POA: Diagnosis not present

## 2017-08-07 DIAGNOSIS — Z79899 Other long term (current) drug therapy: Secondary | ICD-10-CM | POA: Diagnosis not present

## 2017-08-07 DIAGNOSIS — Z0000 Encounter for general adult medical examination without abnormal findings: Secondary | ICD-10-CM | POA: Diagnosis not present

## 2017-09-05 ENCOUNTER — Other Ambulatory Visit: Payer: Self-pay | Admitting: Dermatology

## 2017-09-05 DIAGNOSIS — D235 Other benign neoplasm of skin of trunk: Secondary | ICD-10-CM | POA: Diagnosis not present

## 2017-09-05 DIAGNOSIS — L603 Nail dystrophy: Secondary | ICD-10-CM | POA: Diagnosis not present

## 2017-09-05 DIAGNOSIS — D0461 Carcinoma in situ of skin of right upper limb, including shoulder: Secondary | ICD-10-CM | POA: Diagnosis not present

## 2017-09-05 DIAGNOSIS — D225 Melanocytic nevi of trunk: Secondary | ICD-10-CM | POA: Diagnosis not present

## 2017-09-05 DIAGNOSIS — D2372 Other benign neoplasm of skin of left lower limb, including hip: Secondary | ICD-10-CM | POA: Diagnosis not present

## 2017-09-05 DIAGNOSIS — D2272 Melanocytic nevi of left lower limb, including hip: Secondary | ICD-10-CM | POA: Diagnosis not present

## 2017-10-26 ENCOUNTER — Other Ambulatory Visit: Payer: Self-pay | Admitting: Dermatology

## 2017-10-26 DIAGNOSIS — D485 Neoplasm of uncertain behavior of skin: Secondary | ICD-10-CM | POA: Diagnosis not present

## 2017-10-26 DIAGNOSIS — D0461 Carcinoma in situ of skin of right upper limb, including shoulder: Secondary | ICD-10-CM | POA: Diagnosis not present

## 2017-12-07 DIAGNOSIS — Z23 Encounter for immunization: Secondary | ICD-10-CM | POA: Diagnosis not present

## 2017-12-27 DIAGNOSIS — C61 Malignant neoplasm of prostate: Secondary | ICD-10-CM | POA: Diagnosis not present

## 2018-01-02 DIAGNOSIS — C61 Malignant neoplasm of prostate: Secondary | ICD-10-CM | POA: Diagnosis not present

## 2018-01-02 DIAGNOSIS — N393 Stress incontinence (female) (male): Secondary | ICD-10-CM | POA: Diagnosis not present

## 2018-01-02 DIAGNOSIS — N5201 Erectile dysfunction due to arterial insufficiency: Secondary | ICD-10-CM | POA: Diagnosis not present

## 2018-01-24 ENCOUNTER — Other Ambulatory Visit: Payer: Self-pay | Admitting: Dermatology

## 2018-01-24 DIAGNOSIS — D485 Neoplasm of uncertain behavior of skin: Secondary | ICD-10-CM | POA: Diagnosis not present

## 2018-04-04 DIAGNOSIS — D123 Benign neoplasm of transverse colon: Secondary | ICD-10-CM | POA: Diagnosis not present

## 2018-04-04 DIAGNOSIS — D124 Benign neoplasm of descending colon: Secondary | ICD-10-CM | POA: Diagnosis not present

## 2018-04-04 DIAGNOSIS — K5730 Diverticulosis of large intestine without perforation or abscess without bleeding: Secondary | ICD-10-CM | POA: Diagnosis not present

## 2018-04-04 DIAGNOSIS — Z86010 Personal history of colonic polyps: Secondary | ICD-10-CM | POA: Diagnosis not present

## 2018-04-06 DIAGNOSIS — D124 Benign neoplasm of descending colon: Secondary | ICD-10-CM | POA: Diagnosis not present

## 2018-04-06 DIAGNOSIS — D123 Benign neoplasm of transverse colon: Secondary | ICD-10-CM | POA: Diagnosis not present

## 2018-08-10 DIAGNOSIS — Z1159 Encounter for screening for other viral diseases: Secondary | ICD-10-CM | POA: Diagnosis not present

## 2018-08-10 DIAGNOSIS — I10 Essential (primary) hypertension: Secondary | ICD-10-CM | POA: Diagnosis not present

## 2018-08-10 DIAGNOSIS — Z79899 Other long term (current) drug therapy: Secondary | ICD-10-CM | POA: Diagnosis not present

## 2018-08-14 DIAGNOSIS — Z0000 Encounter for general adult medical examination without abnormal findings: Secondary | ICD-10-CM | POA: Diagnosis not present

## 2019-01-15 DIAGNOSIS — C61 Malignant neoplasm of prostate: Secondary | ICD-10-CM | POA: Diagnosis not present

## 2019-01-17 ENCOUNTER — Other Ambulatory Visit: Payer: Self-pay | Admitting: Dermatology

## 2019-01-17 DIAGNOSIS — L570 Actinic keratosis: Secondary | ICD-10-CM | POA: Diagnosis not present

## 2019-01-17 DIAGNOSIS — L720 Epidermal cyst: Secondary | ICD-10-CM | POA: Diagnosis not present

## 2019-01-22 DIAGNOSIS — C61 Malignant neoplasm of prostate: Secondary | ICD-10-CM | POA: Diagnosis not present

## 2019-01-22 DIAGNOSIS — N393 Stress incontinence (female) (male): Secondary | ICD-10-CM | POA: Diagnosis not present

## 2019-01-22 DIAGNOSIS — N5201 Erectile dysfunction due to arterial insufficiency: Secondary | ICD-10-CM | POA: Diagnosis not present

## 2019-02-25 DIAGNOSIS — F419 Anxiety disorder, unspecified: Secondary | ICD-10-CM | POA: Diagnosis not present

## 2019-02-25 DIAGNOSIS — I10 Essential (primary) hypertension: Secondary | ICD-10-CM | POA: Diagnosis not present

## 2019-04-21 DIAGNOSIS — Z20822 Contact with and (suspected) exposure to covid-19: Secondary | ICD-10-CM | POA: Diagnosis not present

## 2019-05-26 ENCOUNTER — Ambulatory Visit: Payer: Self-pay | Attending: Internal Medicine

## 2019-05-26 DIAGNOSIS — Z23 Encounter for immunization: Secondary | ICD-10-CM | POA: Insufficient documentation

## 2019-05-26 NOTE — Progress Notes (Signed)
   Covid-19 Vaccination Clinic  Name:  Gerald Wolfe    MRN: [id number] DOB: 06-Jan-1956  05/26/2019  Mr. Gerald Wolfe was observed post Covid-19 immunization for 15 minutes without incident. He was provided with Vaccine Information Sheet and instruction to access the V-Safe system.   Mr. Gerald Wolfe was instructed to call 911 with any severe reactions post vaccine: Marland Kitchen Difficulty breathing  . Swelling of face and throat  . A fast heartbeat  . A bad rash all over body  . Dizziness and weakness   Immunizations Administered    Name Date Dose VIS Date Route   Pfizer COVID-19 Vaccine 05/26/2019 11:30 AM 0.3 mL 03/01/2019 Intramuscular   Manufacturer: [place]   Lot: [id number]   [place]: [id number]

## 2019-06-02 DIAGNOSIS — M25562 Pain in left knee: Secondary | ICD-10-CM | POA: Diagnosis not present

## 2019-06-04 DIAGNOSIS — M25562 Pain in left knee: Secondary | ICD-10-CM | POA: Diagnosis not present

## 2019-06-25 ENCOUNTER — Ambulatory Visit: Payer: Self-pay | Attending: Internal Medicine

## 2019-06-25 DIAGNOSIS — Z23 Encounter for immunization: Secondary | ICD-10-CM

## 2019-06-25 NOTE — Progress Notes (Signed)
   Covid-19 Vaccination Clinic  Name:  Gerald Wolfe    MRN: [id number] DOB: Sep 03, 1955  06/25/2019  [name] was observed post Covid-19 immunization for 15 minutes without incident. He was provided with Vaccine Information Sheet and instruction to access the V-Safe system.   [name] was instructed to call 911 with any severe reactions post vaccine: Marland Kitchen Difficulty breathing  . Swelling of face and throat  . A fast heartbeat  . A bad rash all over body  . Dizziness and weakness   Immunizations Administered    Name Date Dose VIS Date Route   Pfizer COVID-19 Vaccine 06/25/2019 11:31 AM 0.3 mL 03/01/2019 Intramuscular   Manufacturer: [employer], [employer]   Lot: [id number]   [place]: [id number]

## 2019-07-02 DIAGNOSIS — M545 Low back pain: Secondary | ICD-10-CM | POA: Diagnosis not present

## 2019-07-02 DIAGNOSIS — M25562 Pain in left knee: Secondary | ICD-10-CM | POA: Diagnosis not present

## 2019-07-02 DIAGNOSIS — M5441 Lumbago with sciatica, right side: Secondary | ICD-10-CM | POA: Diagnosis not present

## 2019-07-02 DIAGNOSIS — M5442 Lumbago with sciatica, left side: Secondary | ICD-10-CM | POA: Diagnosis not present

## 2019-08-13 DIAGNOSIS — Z79899 Other long term (current) drug therapy: Secondary | ICD-10-CM | POA: Diagnosis not present

## 2019-08-13 DIAGNOSIS — I10 Essential (primary) hypertension: Secondary | ICD-10-CM | POA: Diagnosis not present

## 2019-08-15 DIAGNOSIS — Z23 Encounter for immunization: Secondary | ICD-10-CM | POA: Diagnosis not present

## 2019-08-15 DIAGNOSIS — Z0000 Encounter for general adult medical examination without abnormal findings: Secondary | ICD-10-CM | POA: Diagnosis not present

## 2019-10-24 DIAGNOSIS — Z23 Encounter for immunization: Secondary | ICD-10-CM | POA: Diagnosis not present

## 2019-11-01 DIAGNOSIS — I10 Essential (primary) hypertension: Secondary | ICD-10-CM | POA: Diagnosis not present

## 2019-11-05 DIAGNOSIS — M25511 Pain in right shoulder: Secondary | ICD-10-CM | POA: Diagnosis not present

## 2019-12-06 DIAGNOSIS — Z23 Encounter for immunization: Secondary | ICD-10-CM | POA: Diagnosis not present

## 2019-12-06 DIAGNOSIS — I10 Essential (primary) hypertension: Secondary | ICD-10-CM | POA: Diagnosis not present

## 2020-01-14 DIAGNOSIS — C61 Malignant neoplasm of prostate: Secondary | ICD-10-CM | POA: Diagnosis not present

## 2020-01-21 DIAGNOSIS — N393 Stress incontinence (female) (male): Secondary | ICD-10-CM | POA: Diagnosis not present

## 2020-01-21 DIAGNOSIS — N5201 Erectile dysfunction due to arterial insufficiency: Secondary | ICD-10-CM | POA: Diagnosis not present

## 2020-01-21 DIAGNOSIS — N281 Cyst of kidney, acquired: Secondary | ICD-10-CM | POA: Diagnosis not present

## 2020-01-21 DIAGNOSIS — C61 Malignant neoplasm of prostate: Secondary | ICD-10-CM | POA: Diagnosis not present

## 2020-02-28 DIAGNOSIS — I10 Essential (primary) hypertension: Secondary | ICD-10-CM | POA: Diagnosis not present

## 2020-04-02 DIAGNOSIS — I10 Essential (primary) hypertension: Secondary | ICD-10-CM | POA: Diagnosis not present

## 2020-04-02 DIAGNOSIS — F419 Anxiety disorder, unspecified: Secondary | ICD-10-CM | POA: Diagnosis not present

## 2020-11-04 DIAGNOSIS — Z23 Encounter for immunization: Secondary | ICD-10-CM | POA: Diagnosis not present

## 2020-11-04 DIAGNOSIS — Z79899 Other long term (current) drug therapy: Secondary | ICD-10-CM | POA: Diagnosis not present

## 2020-11-04 DIAGNOSIS — I10 Essential (primary) hypertension: Secondary | ICD-10-CM | POA: Diagnosis not present

## 2020-11-04 DIAGNOSIS — Z0000 Encounter for general adult medical examination without abnormal findings: Secondary | ICD-10-CM | POA: Diagnosis not present

## 2021-01-19 DIAGNOSIS — C61 Malignant neoplasm of prostate: Secondary | ICD-10-CM | POA: Diagnosis not present

## 2021-01-26 DIAGNOSIS — N393 Stress incontinence (female) (male): Secondary | ICD-10-CM | POA: Diagnosis not present

## 2021-01-26 DIAGNOSIS — N5201 Erectile dysfunction due to arterial insufficiency: Secondary | ICD-10-CM | POA: Diagnosis not present

## 2021-01-26 DIAGNOSIS — N281 Cyst of kidney, acquired: Secondary | ICD-10-CM | POA: Diagnosis not present

## 2021-01-26 DIAGNOSIS — C61 Malignant neoplasm of prostate: Secondary | ICD-10-CM | POA: Diagnosis not present

## 2021-02-15 ENCOUNTER — Encounter (INDEPENDENT_AMBULATORY_CARE_PROVIDER_SITE_OTHER): Payer: Self-pay

## 2021-02-15 ENCOUNTER — Encounter: Payer: Self-pay | Admitting: Dermatology

## 2021-02-15 ENCOUNTER — Ambulatory Visit: Payer: BC Managed Care – PPO | Admitting: Dermatology

## 2021-02-15 ENCOUNTER — Other Ambulatory Visit: Payer: Self-pay | Admitting: Dermatology

## 2021-02-15 ENCOUNTER — Other Ambulatory Visit: Payer: Self-pay

## 2021-02-15 DIAGNOSIS — D2371 Other benign neoplasm of skin of right lower limb, including hip: Secondary | ICD-10-CM | POA: Diagnosis not present

## 2021-02-15 DIAGNOSIS — Z1283 Encounter for screening for malignant neoplasm of skin: Secondary | ICD-10-CM | POA: Diagnosis not present

## 2021-02-15 DIAGNOSIS — L570 Actinic keratosis: Secondary | ICD-10-CM | POA: Diagnosis not present

## 2021-02-15 DIAGNOSIS — D485 Neoplasm of uncertain behavior of skin: Secondary | ICD-10-CM

## 2021-02-15 MED ORDER — TOLAK 4 % EX CREA: TOPICAL_CREAM | CUTANEOUS | 1 refills | Status: DC

## 2021-02-15 NOTE — Patient Instructions (Signed)

## 2021-03-06 ENCOUNTER — Encounter: Payer: Self-pay | Admitting: Dermatology

## 2021-03-06 NOTE — Progress Notes (Signed)
° °  Follow-Up Visit   Subjective  Gerald Wolfe is a 65 y.o. male who presents for the following: Annual Exam (Rough/crusty spot on left ear rim & crown of scalp).  New crusted spots on head, neck, leg Location:  Duration:  Quality:  Associated Signs/Symptoms: Modifying Factors:  Severity:  Timing: Context:   Objective  Well appearing patient in no apparent distress; mood and affect are within normal limits. Right Upper Back, Torso - Posterior (Back) Waist up skin examination, no atypical pigmented lesions.  1 possible nonmelanoma skin cancer leg will be biopsied and treated  Left Ear, Mid Parietal Scalp (5), Right Parotid Area, Right Temporal Scalp Multiple gritty and hornlike 2 to 5 mm gross  Right Lower Leg - Posterior 1 cm waxy pink crust, rule out superficial carcinoma         A full examination was performed including scalp, head, eyes, ears, nose, lips, neck, chest, axillae, abdomen, back, buttocks, bilateral upper extremities, bilateral lower extremities, hands, feet, fingers, toes, fingernails, and toenails. All findings within normal limits unless otherwise noted below.  Areas beneath undergarments not fully examined.   Assessment & Plan    Encounter for screening for malignant neoplasm of skin (2) Torso - Posterior (Back); Right Upper Back  Annual skin examination  AK (actinic keratosis) (8) Left Ear; Mid Parietal Scalp (5); Right Parotid Area; Right Temporal Scalp  Destruction of lesion - Left Ear, Mid Parietal Scalp, Right Parotid Area, Right Temporal Scalp Complexity: simple   Destruction method: cryotherapy   Informed consent: discussed and consent obtained   Timeout:  patient name, date of birth, surgical site, and procedure verified Lesion destroyed using liquid nitrogen: Yes   Cryotherapy cycles:  3 Outcome: patient tolerated procedure well with no complications    Fluorouracil (TOLAK) 4 % CREA - Left Ear, Mid Parietal Scalp Apply to  affected area qhs x 35-32 applications  Neoplasm of uncertain behavior of skin Right Lower Leg - Posterior  Skin / nail biopsy Type of biopsy: tangential   Informed consent: discussed and consent obtained   Timeout: patient name, date of birth, surgical site, and procedure verified   Procedure prep:  Patient was prepped and draped in usual sterile fashion (Non sterile) Prep type:  Chlorhexidine Anesthesia: the lesion was anesthetized in a standard fashion   Anesthetic:  1% lidocaine w/ epinephrine 1-100,000 local infiltration Instrument used: flexible razor blade   Outcome: patient tolerated procedure well   Post-procedure details: wound care instructions given    Destruction of lesion Complexity: simple   Destruction method: electrodesiccation and curettage   Informed consent: discussed and consent obtained   Timeout:  patient name, date of birth, surgical site, and procedure verified Anesthesia: the lesion was anesthetized in a standard fashion   Anesthetic:  1% lidocaine w/ epinephrine 1-100,000 local infiltration Curettage performed in three different directions: Yes   Curettage cycles:  1 Margin per side (cm):  0.1 Final wound size (cm):  1 Hemostasis achieved with:  aluminum chloride Outcome: patient tolerated procedure well with no complications   Post-procedure details: wound care instructions given    Specimen 1 - Surgical pathology Differential Diagnosis: r/o sk,- txpbx  Check Margins: No  After shave biopsy the base of the lesion was treated with curettage plus cautery      I, [name], MD, have reviewed all documentation for this visit.  The documentation on 03/06/21 for the exam, diagnosis, procedures, and orders are all accurate and complete.

## 2021-04-26 DIAGNOSIS — S3802XA Crushing injury of scrotum and testis, initial encounter: Secondary | ICD-10-CM | POA: Diagnosis not present

## 2021-04-26 DIAGNOSIS — N451 Epididymitis: Secondary | ICD-10-CM | POA: Diagnosis not present

## 2021-07-21 DIAGNOSIS — I10 Essential (primary) hypertension: Secondary | ICD-10-CM | POA: Diagnosis not present

## 2021-07-21 DIAGNOSIS — G479 Sleep disorder, unspecified: Secondary | ICD-10-CM | POA: Diagnosis not present

## 2022-01-14 DIAGNOSIS — J069 Acute upper respiratory infection, unspecified: Secondary | ICD-10-CM | POA: Diagnosis not present

## 2022-01-14 DIAGNOSIS — J0110 Acute frontal sinusitis, unspecified: Secondary | ICD-10-CM | POA: Diagnosis not present

## 2022-01-20 DIAGNOSIS — C61 Malignant neoplasm of prostate: Secondary | ICD-10-CM | POA: Diagnosis not present

## 2022-01-26 DIAGNOSIS — I10 Essential (primary) hypertension: Secondary | ICD-10-CM | POA: Diagnosis not present

## 2022-01-26 DIAGNOSIS — Z79899 Other long term (current) drug therapy: Secondary | ICD-10-CM | POA: Diagnosis not present

## 2022-01-27 DIAGNOSIS — N5201 Erectile dysfunction due to arterial insufficiency: Secondary | ICD-10-CM | POA: Diagnosis not present

## 2022-01-27 DIAGNOSIS — C61 Malignant neoplasm of prostate: Secondary | ICD-10-CM | POA: Diagnosis not present

## 2022-01-27 DIAGNOSIS — N393 Stress incontinence (female) (male): Secondary | ICD-10-CM | POA: Diagnosis not present

## 2022-01-28 DIAGNOSIS — I10 Essential (primary) hypertension: Secondary | ICD-10-CM | POA: Diagnosis not present

## 2022-01-28 DIAGNOSIS — Z79899 Other long term (current) drug therapy: Secondary | ICD-10-CM | POA: Diagnosis not present

## 2022-01-28 DIAGNOSIS — Z0000 Encounter for general adult medical examination without abnormal findings: Secondary | ICD-10-CM | POA: Diagnosis not present

## 2022-01-28 DIAGNOSIS — M5432 Sciatica, left side: Secondary | ICD-10-CM | POA: Diagnosis not present

## 2022-04-07 DIAGNOSIS — M25562 Pain in left knee: Secondary | ICD-10-CM | POA: Diagnosis not present

## 2022-04-07 DIAGNOSIS — M545 Low back pain, unspecified: Secondary | ICD-10-CM | POA: Diagnosis not present

## 2022-04-07 DIAGNOSIS — M5442 Lumbago with sciatica, left side: Secondary | ICD-10-CM | POA: Diagnosis not present

## 2022-05-05 DIAGNOSIS — M545 Low back pain, unspecified: Secondary | ICD-10-CM | POA: Diagnosis not present

## 2022-05-05 DIAGNOSIS — M25562 Pain in left knee: Secondary | ICD-10-CM | POA: Diagnosis not present

## 2022-05-16 DIAGNOSIS — M25562 Pain in left knee: Secondary | ICD-10-CM | POA: Diagnosis not present

## 2022-05-26 DIAGNOSIS — M25562 Pain in left knee: Secondary | ICD-10-CM | POA: Diagnosis not present

## 2022-05-26 DIAGNOSIS — M5442 Lumbago with sciatica, left side: Secondary | ICD-10-CM | POA: Diagnosis not present

## 2022-05-26 DIAGNOSIS — S83242A Other tear of medial meniscus, current injury, left knee, initial encounter: Secondary | ICD-10-CM | POA: Diagnosis not present

## 2022-07-12 DIAGNOSIS — H6692 Otitis media, unspecified, left ear: Secondary | ICD-10-CM | POA: Diagnosis not present

## 2022-07-12 DIAGNOSIS — H6993 Unspecified Eustachian tube disorder, bilateral: Secondary | ICD-10-CM | POA: Diagnosis not present

## 2022-07-21 DIAGNOSIS — H6992 Unspecified Eustachian tube disorder, left ear: Secondary | ICD-10-CM | POA: Diagnosis not present

## 2022-07-25 DIAGNOSIS — S83232A Complex tear of medial meniscus, current injury, left knee, initial encounter: Secondary | ICD-10-CM | POA: Diagnosis not present

## 2022-07-25 DIAGNOSIS — M6752 Plica syndrome, left knee: Secondary | ICD-10-CM | POA: Diagnosis not present

## 2022-07-25 DIAGNOSIS — S83272A Complex tear of lateral meniscus, current injury, left knee, initial encounter: Secondary | ICD-10-CM | POA: Diagnosis not present

## 2022-07-25 DIAGNOSIS — M25462 Effusion, left knee: Secondary | ICD-10-CM | POA: Diagnosis not present

## 2022-07-25 DIAGNOSIS — Y999 Unspecified external cause status: Secondary | ICD-10-CM | POA: Diagnosis not present

## 2022-07-25 DIAGNOSIS — X58XXXA Exposure to other specified factors, initial encounter: Secondary | ICD-10-CM | POA: Diagnosis not present

## 2022-07-25 DIAGNOSIS — M94262 Chondromalacia, left knee: Secondary | ICD-10-CM | POA: Diagnosis not present

## 2022-08-04 DIAGNOSIS — M94262 Chondromalacia, left knee: Secondary | ICD-10-CM | POA: Diagnosis not present

## 2022-08-04 DIAGNOSIS — M25562 Pain in left knee: Secondary | ICD-10-CM | POA: Diagnosis not present

## 2022-08-04 DIAGNOSIS — M25662 Stiffness of left knee, not elsewhere classified: Secondary | ICD-10-CM | POA: Diagnosis not present

## 2022-08-04 DIAGNOSIS — S83222D Peripheral tear of medial meniscus, current injury, left knee, subsequent encounter: Secondary | ICD-10-CM | POA: Diagnosis not present

## 2022-08-04 DIAGNOSIS — S83262D Peripheral tear of lateral meniscus, current injury, left knee, subsequent encounter: Secondary | ICD-10-CM | POA: Diagnosis not present

## 2022-08-09 DIAGNOSIS — M94262 Chondromalacia, left knee: Secondary | ICD-10-CM | POA: Diagnosis not present

## 2022-08-09 DIAGNOSIS — S83222D Peripheral tear of medial meniscus, current injury, left knee, subsequent encounter: Secondary | ICD-10-CM | POA: Diagnosis not present

## 2022-08-09 DIAGNOSIS — S83262D Peripheral tear of lateral meniscus, current injury, left knee, subsequent encounter: Secondary | ICD-10-CM | POA: Diagnosis not present

## 2022-08-09 DIAGNOSIS — M25662 Stiffness of left knee, not elsewhere classified: Secondary | ICD-10-CM | POA: Diagnosis not present

## 2022-08-11 DIAGNOSIS — S83222D Peripheral tear of medial meniscus, current injury, left knee, subsequent encounter: Secondary | ICD-10-CM | POA: Diagnosis not present

## 2022-08-11 DIAGNOSIS — M94262 Chondromalacia, left knee: Secondary | ICD-10-CM | POA: Diagnosis not present

## 2022-08-11 DIAGNOSIS — S83262D Peripheral tear of lateral meniscus, current injury, left knee, subsequent encounter: Secondary | ICD-10-CM | POA: Diagnosis not present

## 2022-08-11 DIAGNOSIS — M25662 Stiffness of left knee, not elsewhere classified: Secondary | ICD-10-CM | POA: Diagnosis not present

## 2022-08-17 DIAGNOSIS — M25662 Stiffness of left knee, not elsewhere classified: Secondary | ICD-10-CM | POA: Diagnosis not present

## 2022-08-17 DIAGNOSIS — S83262D Peripheral tear of lateral meniscus, current injury, left knee, subsequent encounter: Secondary | ICD-10-CM | POA: Diagnosis not present

## 2022-08-17 DIAGNOSIS — S83222D Peripheral tear of medial meniscus, current injury, left knee, subsequent encounter: Secondary | ICD-10-CM | POA: Diagnosis not present

## 2022-08-17 DIAGNOSIS — M94262 Chondromalacia, left knee: Secondary | ICD-10-CM | POA: Diagnosis not present

## 2022-08-19 DIAGNOSIS — M25662 Stiffness of left knee, not elsewhere classified: Secondary | ICD-10-CM | POA: Diagnosis not present

## 2022-08-19 DIAGNOSIS — S83262D Peripheral tear of lateral meniscus, current injury, left knee, subsequent encounter: Secondary | ICD-10-CM | POA: Diagnosis not present

## 2022-08-19 DIAGNOSIS — M94262 Chondromalacia, left knee: Secondary | ICD-10-CM | POA: Diagnosis not present

## 2022-08-19 DIAGNOSIS — S83222D Peripheral tear of medial meniscus, current injury, left knee, subsequent encounter: Secondary | ICD-10-CM | POA: Diagnosis not present

## 2022-08-22 DIAGNOSIS — K080 Exfoliation of teeth due to systemic causes: Secondary | ICD-10-CM | POA: Diagnosis not present

## 2022-08-23 DIAGNOSIS — S83262D Peripheral tear of lateral meniscus, current injury, left knee, subsequent encounter: Secondary | ICD-10-CM | POA: Diagnosis not present

## 2022-08-23 DIAGNOSIS — M94262 Chondromalacia, left knee: Secondary | ICD-10-CM | POA: Diagnosis not present

## 2022-08-23 DIAGNOSIS — S83222D Peripheral tear of medial meniscus, current injury, left knee, subsequent encounter: Secondary | ICD-10-CM | POA: Diagnosis not present

## 2022-08-23 DIAGNOSIS — M25662 Stiffness of left knee, not elsewhere classified: Secondary | ICD-10-CM | POA: Diagnosis not present

## 2022-08-25 DIAGNOSIS — S83262D Peripheral tear of lateral meniscus, current injury, left knee, subsequent encounter: Secondary | ICD-10-CM | POA: Diagnosis not present

## 2022-08-25 DIAGNOSIS — M94262 Chondromalacia, left knee: Secondary | ICD-10-CM | POA: Diagnosis not present

## 2022-08-25 DIAGNOSIS — S83222D Peripheral tear of medial meniscus, current injury, left knee, subsequent encounter: Secondary | ICD-10-CM | POA: Diagnosis not present

## 2022-08-25 DIAGNOSIS — M25662 Stiffness of left knee, not elsewhere classified: Secondary | ICD-10-CM | POA: Diagnosis not present

## 2022-08-26 DIAGNOSIS — I10 Essential (primary) hypertension: Secondary | ICD-10-CM | POA: Diagnosis not present

## 2022-08-26 DIAGNOSIS — Z0000 Encounter for general adult medical examination without abnormal findings: Secondary | ICD-10-CM | POA: Diagnosis not present

## 2022-08-26 DIAGNOSIS — F419 Anxiety disorder, unspecified: Secondary | ICD-10-CM | POA: Diagnosis not present

## 2022-08-30 DIAGNOSIS — M25662 Stiffness of left knee, not elsewhere classified: Secondary | ICD-10-CM | POA: Diagnosis not present

## 2022-08-30 DIAGNOSIS — S83262D Peripheral tear of lateral meniscus, current injury, left knee, subsequent encounter: Secondary | ICD-10-CM | POA: Diagnosis not present

## 2022-08-30 DIAGNOSIS — M94262 Chondromalacia, left knee: Secondary | ICD-10-CM | POA: Diagnosis not present

## 2022-08-30 DIAGNOSIS — S83222D Peripheral tear of medial meniscus, current injury, left knee, subsequent encounter: Secondary | ICD-10-CM | POA: Diagnosis not present

## 2022-09-01 DIAGNOSIS — S83222D Peripheral tear of medial meniscus, current injury, left knee, subsequent encounter: Secondary | ICD-10-CM | POA: Diagnosis not present

## 2022-09-01 DIAGNOSIS — M94262 Chondromalacia, left knee: Secondary | ICD-10-CM | POA: Diagnosis not present

## 2022-09-01 DIAGNOSIS — M25662 Stiffness of left knee, not elsewhere classified: Secondary | ICD-10-CM | POA: Diagnosis not present

## 2022-09-01 DIAGNOSIS — S83262D Peripheral tear of lateral meniscus, current injury, left knee, subsequent encounter: Secondary | ICD-10-CM | POA: Diagnosis not present

## 2022-09-07 DIAGNOSIS — M94262 Chondromalacia, left knee: Secondary | ICD-10-CM | POA: Diagnosis not present

## 2022-09-07 DIAGNOSIS — S83262D Peripheral tear of lateral meniscus, current injury, left knee, subsequent encounter: Secondary | ICD-10-CM | POA: Diagnosis not present

## 2022-09-07 DIAGNOSIS — M25662 Stiffness of left knee, not elsewhere classified: Secondary | ICD-10-CM | POA: Diagnosis not present

## 2022-09-07 DIAGNOSIS — S83222D Peripheral tear of medial meniscus, current injury, left knee, subsequent encounter: Secondary | ICD-10-CM | POA: Diagnosis not present

## 2022-09-13 DIAGNOSIS — S83222D Peripheral tear of medial meniscus, current injury, left knee, subsequent encounter: Secondary | ICD-10-CM | POA: Diagnosis not present

## 2022-09-13 DIAGNOSIS — M25662 Stiffness of left knee, not elsewhere classified: Secondary | ICD-10-CM | POA: Diagnosis not present

## 2022-09-13 DIAGNOSIS — M94262 Chondromalacia, left knee: Secondary | ICD-10-CM | POA: Diagnosis not present

## 2022-09-13 DIAGNOSIS — S83262D Peripheral tear of lateral meniscus, current injury, left knee, subsequent encounter: Secondary | ICD-10-CM | POA: Diagnosis not present

## 2022-09-15 DIAGNOSIS — M94262 Chondromalacia, left knee: Secondary | ICD-10-CM | POA: Diagnosis not present

## 2022-09-15 DIAGNOSIS — S83222D Peripheral tear of medial meniscus, current injury, left knee, subsequent encounter: Secondary | ICD-10-CM | POA: Diagnosis not present

## 2022-09-15 DIAGNOSIS — S83262D Peripheral tear of lateral meniscus, current injury, left knee, subsequent encounter: Secondary | ICD-10-CM | POA: Diagnosis not present

## 2022-09-15 DIAGNOSIS — M25662 Stiffness of left knee, not elsewhere classified: Secondary | ICD-10-CM | POA: Diagnosis not present

## 2022-09-16 DIAGNOSIS — I10 Essential (primary) hypertension: Secondary | ICD-10-CM | POA: Diagnosis not present

## 2022-09-19 DIAGNOSIS — M25662 Stiffness of left knee, not elsewhere classified: Secondary | ICD-10-CM | POA: Diagnosis not present

## 2022-09-19 DIAGNOSIS — M94262 Chondromalacia, left knee: Secondary | ICD-10-CM | POA: Diagnosis not present

## 2022-09-19 DIAGNOSIS — S83262D Peripheral tear of lateral meniscus, current injury, left knee, subsequent encounter: Secondary | ICD-10-CM | POA: Diagnosis not present

## 2022-09-19 DIAGNOSIS — S83222D Peripheral tear of medial meniscus, current injury, left knee, subsequent encounter: Secondary | ICD-10-CM | POA: Diagnosis not present

## 2022-09-21 DIAGNOSIS — M94262 Chondromalacia, left knee: Secondary | ICD-10-CM | POA: Diagnosis not present

## 2022-09-21 DIAGNOSIS — S83262D Peripheral tear of lateral meniscus, current injury, left knee, subsequent encounter: Secondary | ICD-10-CM | POA: Diagnosis not present

## 2022-09-21 DIAGNOSIS — S83222D Peripheral tear of medial meniscus, current injury, left knee, subsequent encounter: Secondary | ICD-10-CM | POA: Diagnosis not present

## 2022-09-21 DIAGNOSIS — M25662 Stiffness of left knee, not elsewhere classified: Secondary | ICD-10-CM | POA: Diagnosis not present

## 2022-09-27 DIAGNOSIS — S83262D Peripheral tear of lateral meniscus, current injury, left knee, subsequent encounter: Secondary | ICD-10-CM | POA: Diagnosis not present

## 2022-09-27 DIAGNOSIS — M94262 Chondromalacia, left knee: Secondary | ICD-10-CM | POA: Diagnosis not present

## 2022-09-27 DIAGNOSIS — S83222D Peripheral tear of medial meniscus, current injury, left knee, subsequent encounter: Secondary | ICD-10-CM | POA: Diagnosis not present

## 2022-09-27 DIAGNOSIS — M25662 Stiffness of left knee, not elsewhere classified: Secondary | ICD-10-CM | POA: Diagnosis not present

## 2022-12-28 DIAGNOSIS — K080 Exfoliation of teeth due to systemic causes: Secondary | ICD-10-CM | POA: Diagnosis not present

## 2023-01-11 DIAGNOSIS — H5203 Hypermetropia, bilateral: Secondary | ICD-10-CM | POA: Diagnosis not present

## 2023-01-17 DIAGNOSIS — H524 Presbyopia: Secondary | ICD-10-CM | POA: Diagnosis not present

## 2023-01-24 DIAGNOSIS — C61 Malignant neoplasm of prostate: Secondary | ICD-10-CM | POA: Diagnosis not present

## 2023-01-31 DIAGNOSIS — N5201 Erectile dysfunction due to arterial insufficiency: Secondary | ICD-10-CM | POA: Diagnosis not present

## 2023-01-31 DIAGNOSIS — C61 Malignant neoplasm of prostate: Secondary | ICD-10-CM | POA: Diagnosis not present

## 2023-01-31 DIAGNOSIS — N393 Stress incontinence (female) (male): Secondary | ICD-10-CM | POA: Diagnosis not present

## 2023-02-02 DIAGNOSIS — R739 Hyperglycemia, unspecified: Secondary | ICD-10-CM | POA: Diagnosis not present

## 2023-02-02 DIAGNOSIS — C61 Malignant neoplasm of prostate: Secondary | ICD-10-CM | POA: Diagnosis not present

## 2023-02-02 DIAGNOSIS — Z79899 Other long term (current) drug therapy: Secondary | ICD-10-CM | POA: Diagnosis not present

## 2023-02-02 DIAGNOSIS — I10 Essential (primary) hypertension: Secondary | ICD-10-CM | POA: Diagnosis not present

## 2023-02-07 DIAGNOSIS — F330 Major depressive disorder, recurrent, mild: Secondary | ICD-10-CM | POA: Diagnosis not present

## 2023-02-07 DIAGNOSIS — E7800 Pure hypercholesterolemia, unspecified: Secondary | ICD-10-CM | POA: Diagnosis not present

## 2023-02-07 DIAGNOSIS — Z0000 Encounter for general adult medical examination without abnormal findings: Secondary | ICD-10-CM | POA: Diagnosis not present

## 2023-02-07 DIAGNOSIS — R739 Hyperglycemia, unspecified: Secondary | ICD-10-CM | POA: Diagnosis not present

## 2023-02-07 DIAGNOSIS — I10 Essential (primary) hypertension: Secondary | ICD-10-CM | POA: Diagnosis not present

## 2023-03-07 DIAGNOSIS — M791 Myalgia, unspecified site: Secondary | ICD-10-CM | POA: Diagnosis not present

## 2023-03-07 DIAGNOSIS — M1711 Unilateral primary osteoarthritis, right knee: Secondary | ICD-10-CM | POA: Diagnosis not present

## 2023-04-04 DIAGNOSIS — M1711 Unilateral primary osteoarthritis, right knee: Secondary | ICD-10-CM | POA: Diagnosis not present

## 2023-04-21 ENCOUNTER — Ambulatory Visit (HOSPITAL_BASED_OUTPATIENT_CLINIC_OR_DEPARTMENT_OTHER): Payer: Medicare Other | Admitting: Cardiovascular Disease

## 2023-04-21 ENCOUNTER — Encounter (HOSPITAL_BASED_OUTPATIENT_CLINIC_OR_DEPARTMENT_OTHER): Payer: Self-pay | Admitting: Cardiovascular Disease

## 2023-04-21 VITALS — BP 190/88 | HR 66 | Ht 75.5 in | Wt 243.6 lb

## 2023-04-21 DIAGNOSIS — E785 Hyperlipidemia, unspecified: Secondary | ICD-10-CM

## 2023-04-21 DIAGNOSIS — E7800 Pure hypercholesterolemia, unspecified: Secondary | ICD-10-CM | POA: Diagnosis not present

## 2023-04-21 DIAGNOSIS — I10 Essential (primary) hypertension: Secondary | ICD-10-CM

## 2023-04-21 MED ORDER — CARVEDILOL 25 MG PO TABS: 25.0000 mg | ORAL_TABLET | Freq: Two times a day (BID) | ORAL | 3 refills | Status: DC

## 2023-04-21 NOTE — Progress Notes (Signed)
Advanced Hypertension Clinic Initial Assessment:    Date:  05/08/2023   ID:  Gerald Wolfe, DOB 04/29/1955, MRN [id number]  PCP:  Gerald Palmer, MD  Cardiologist:  None  Nephrologist:  Referring MD: Gerald Palmer, MD   CC: Hypertension  History of Present Illness:    Gerald Wolfe is a 68 y.o. male with a hx of hypertension, hyperlipidemia, and anxiety here to establish care in the [facility]pert[facility].  He has been working with Dr. Paulino Rily and struggled to control blood pressure despite being on telmisartan/HCTZ and metoprolol.  He was last seen 01/2023 and blood pressure was 160/75 so he was referred to the advanced hypertension clinic.  [name] has been managing hypertension for two and a half years. Initially treated with lisinopril and hydrochlorothiazide, he was switched to amlodipine, which caused ankle and lower leg edema. After discontinuing amlodipine, he was prescribed telmisartan, which he feels is not more effective than lisinopril. Currently, he is on telmisartan and metoprolol, but his blood pressure remains uncontrolled.  He has experienced weight fluctuations affecting his blood pressure. Initially weighing 253 pounds, he lost 37 pounds through diet and exercise, reaching 209 pounds, which allowed him to stop medication for over a year. However, as his weight increased past 225 pounds, his blood pressure rose again. He is currently trying to lose weight by eating between 11 AM and 7 PM and has lost about 6-7 pounds, now weighing 238 pounds.  He exercises regularly using a treadmill and recumbent bike but experiences knee pain, limiting his activity. He had arthroscopic surgery on one knee last May and cannot run due to knee issues. He experiences knee pain after 20-25 minutes of fast walking or biking.  He consumes 2-3 cups of coffee daily without sweeteners and uses minimal salt in his diet. He avoids NSAIDs but uses diclofenac gel occasionally for  pain. He takes CoQ10, a multivitamin, and protein powder as supplements.  He reports snoring, but his wife has not observed any breathing issues during sleep. He averages 5-7 hours of sleep per night and feels rested most of the time. No palpitations or heart racing. His home blood pressure readings are high but not as elevated as in the office.  He has a family history of hypertension, with a sister and a deceased sister who took blood pressure medication. His brother, aged 37, previously took blood pressure medication but no longer does. Another brother, also deceased, did not take any blood pressure medication. There is no known family history of heart attacks, strokes, or chronic kidney disease requiring dialysis.    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    Past Surgical History:  Procedure Laterality Date   COLONOSCOPY     FOOT SURGERY Left 2013   debridement/ removal spur   LYMPHADENECTOMY Bilateral 03/11/2015   Procedure: LYMPHADENECTOMY;  Surgeon: Sebastian Ache, MD;  Location: [facility];  Service: Urology;  Laterality: Bilateral;   PROSTATE BIOPSY N/A 01/23/2015  Procedure: SATURATED BIOPSY TRANSRECTAL ULTRASONIC PROSTATE (TUBP);  Surgeon: Malen Gauze, MD;  Location: [facility];  Service: Urology;  Laterality: N/A;   ROBOT ASSISTED LAPAROSCOPIC RADICAL PROSTATECTOMY N/A 03/11/2015   Procedure: ROBOTIC ASSISTED LAPAROSCOPIC RADICAL PROSTATECTOMY WITH INDOCYANINE  GREEN DYE;  Surgeon:  Sebastian Ache, MD;  Location: [facility];  Service: Urology;  Laterality: N/A;    Current Medications: Current Meds  Medication Sig   ALPRAZolam (XANAX) 0.5 MG tablet Take 0.5 mg by mouth at bedtime as needed for sleep.    carvedilol (COREG) 25 MG tablet Take 1 tablet (25 mg total) by mouth 2 (two) times daily.   Fluorouracil (TOLAK) 4 % CREA Apply to affected area qhs x 25-28 applications   fluticasone (FLONASE) 50 MCG/ACT nasal spray Place 1 spray into both nostrils daily as needed for allergies or rhinitis.   Loratadine (CLARITIN PO) Take 1 tablet by mouth daily.   meloxicam (MOBIC) 15 MG tablet Take 15 mg by mouth daily.   telmisartan-hydrochlorothiazide (MICARDIS HCT) 80-25 MG tablet Take 1 tablet by mouth daily.   [DISCONTINUED] metoprolol succinate (TOPROL-XL) 100 MG 24 hr tablet Take 100 mg by mouth daily.   [DISCONTINUED] metoprolol succinate (TOPROL-XL) 50 MG 24 hr tablet Take 50 mg by mouth daily. Take with or immediately following a meal.     Allergies:   Penicillins   Social History   Socioeconomic History   Marital status: Married    Spouse name: Not on file   Number of children: Not on file   Years of education: Not on file   Highest education level: Not on file  Occupational History   Not on file  Tobacco Use   Smoking status: Former    Current packs/day: 0.00    Types: Cigarettes    Start date: 01/15/1970    Quit date: 01/15/1977    Years since quitting: 46.3   Smokeless tobacco: Never  Substance and Sexual Activity   Alcohol use: Yes    Alcohol/week: 5.0 - 7.0 standard drinks of alcohol    Types: 5 - 7 Shots of liquor per week    Comment: 4-8 drinks per week   Drug use: No   Sexual activity: Not on file  Other Topics Concern   Not on file  Social History Narrative   Not on file   Social Drivers of Health   Financial Resource Strain: Not on file  Food Insecurity: Not on file  Transportation Needs: Not on file  Physical Activity: Not on file   Stress: Not on file  Social Connections: Not on file     Family History: The patient's family history includes Allergies in his sister; Cancer in his father and sister; Clotting disorder in his mother; Hypertension in his brother and sister; Kidney disease in his brother.  ROS:   Please see the history of present illness.     All other systems reviewed and are negative.  EKGs/Labs/Other Studies Reviewed:    EKG:  EKG is  ordered today.   EKG Interpretation Date/Time:  Friday April 21 2023 13:53:10 EST Ventricular Rate:  63 PR Interval:  174 QRS Duration:  104 QT Interval:  464 QTC Calculation: 474 R Axis:   41  Text Interpretation: Sinus rhythm with occasional Premature ventricular complexes When compared with ECG of 23-Jan-2015 10:04, Premature ventricular complexes are now Present Confirmed by Chilton Si ([id number]) on 04/21/2023 2:02:55 PM        Recent Labs: No results found for requested  labs within last 365 days.   Recent Lipid Panel No results found for: "CHOL", "TRIG", "HDL", "CHOLHDL", "VLDL", "LDLCALC", "LDLDIRECT"  Physical Exam:   VS:  BP (!) 190/88   Pulse 66   Ht 6' 3.5" (1.918 m)   Wt 243 lb 9.6 oz (110.5 kg)   SpO2 99%   BMI 30.05 kg/m  , BMI Body mass index is 30.05 kg/m. GENERAL:  Well appearing HEENT: Pupils equal round and reactive, fundi not visualized, oral mucosa unremarkable NECK:  No jugular venous distention, waveform within normal limits, carotid upstroke brisk and symmetric, no bruits, no thyromegaly LUNGS:  Clear to auscultation bilaterally HEART:  RRR.  PMI not displaced or sustained,S1 and S2 within normal limits, no S3, no S4, no clicks, no rubs, no murmurs ABD:  Flat, positive bowel sounds normal in frequency in pitch, no bruits, no rebound, no guarding, no midline pulsatile mass, no hepatomegaly, no splenomegaly EXT:  2 plus pulses throughout, no edema, no cyanosis no clubbing SKIN:  No rashes no nodules NEURO:  Cranial  nerves II through XII grossly intact, motor grossly intact throughout PSYCH:  Cognitively intact, oriented to person place and time   ASSESSMENT/PLAN:    # Resistant Hypertension Uncontrolled blood pressure despite being on metoprolol, telmisartan and Hydrochlorothiazide. History of medication changes due to side effects. Patient is making lifestyle modifications including weight loss and dietary changes. -Switch Metoprolol to Carvedilol for better blood pressure control. -Order labs to check for overproduction of aldosterone. -Order ultrasound of renal arteries to rule out renal artery stenosis. -Consider sleep apnea as a potential contributing factor; advised patient to discuss with spouse and consider home sleep study. -Continue lifestyle modifications including weight loss, salt restriction, and regular exercise. -Follow-up in 1 month to reassess blood pressure control.  # Hyperlipidemia Elevated cholesterol levels noted. -Continue lifestyle modifications including diet and exercise. -Consider lipid-lowering therapy if lifestyle modifications are insufficient.  # Premature Ventricular Contractions (PVCs) Noted on EKG, but patient is asymptomatic. -Continue monitoring, no immediate intervention required.      Screening for Secondary Hypertension:     04/21/2023    2:11 PM  Causes  Drugs/Herbals Screened     - Comments limits sodium intake.  cooks at home. 2-3 alcoholic drinks 4 days per week.  2-3 coffees daily. No NSAIDS.  Renovascular HTN Screened     - Comments Check renal Dopplers  Thyroid Disease Screened  Hyperaldosteronism Screened     - Comments check renin and aldosterone  Pheochromocytoma Screened     - Comments no palpitations  Cushing's Syndrome N/A  Hyperparathyroidism Screened  Coarctation of the Aorta Screened     - Comments BP symmetric  Compliance Screened    Relevant Labs/Studies:    Latest Ref Rng & Units 03/12/2015    4:18 AM 03/06/2015    9:25  AM 01/23/2015   10:37 AM  Basic Labs  Sodium 135 - 145 mmol/L 138  141  145   Potassium 3.5 - 5.1 mmol/L 4.1  4.0  3.4   Creatinine 0.61 - 1.24 mg/dL 3.08  6.57  8.46           Latest Ref Rng & Units 04/25/2023    9:08 AM  Renin/Aldosterone   Aldosterone 0.0 - 30.0 ng/dL 96.2   Aldos/Renin Ratio 0.0 - 30.0 23.1              04/21/2023    2:38 PM  Renovascular   Renal Artery [place] Completed Yes  Disposition:    FU with MD/PharmD in 1 month    Medication Adjustments/Labs and Tests Ordered: Current medicines are reviewed at length with the patient today.  Concerns regarding medicines are outlined above.  Orders Placed This Encounter  Procedures   Aldosterone + renin activity w/ ratio   EKG 12-Lead   VAS US RENAL ARTERY DUPLEX   Meds ordered this encounter  Medications   carvedilol (COREG) 25 MG tablet    Sig: Take 1 tablet (25 mg total) by mouth 2 (two) times daily.    Dispense:  180 tablet    Refill:  3    D/C METOPROLOL     Signed, Chilton Si, MD  05/08/2023 10:55 PM    [facility]

## 2023-04-21 NOTE — Patient Instructions (Signed)
Medication Instructions:  STOP METOPROLOL   START CARVEDILOL 25 MG TWICE A DAY   Labwork: RENIN/ALDOSTERONE SOON   Testing/Procedures: Your physician has requested that you have a renal artery duplex. During this test, an ultrasound is used to evaluate blood flow to the kidneys. Allow one hour for this exam. Do not eat after midnight the day before and avoid carbonated beverages. Take your medications as you usually do.  Follow-Up: MONTHLY FOR 3 VISITS WITH PHARM D  DR [place] 1 MONTH AFTER LAST PHARM D VISIT  Any Other Special Instructions Will Be Listed Below (If Applicable). MONITOR YOUR BLOOD PRESSURE TWICE A DAY, LOG IN THE BOOK PROVIDED. BRING THE BOOK AND YOUR BLOOD PRESSURE MACHINE TO YOUR FOLLOW UP IN 1 MONTH   If you need a refill on your cardiac medications before your next appointment, please call your pharmacy.

## 2023-04-25 DIAGNOSIS — I10 Essential (primary) hypertension: Secondary | ICD-10-CM | POA: Diagnosis not present

## 2023-05-02 LAB — ALDOSTERONE + RENIN ACTIVITY W/ RATIO
Aldos/Renin Ratio: 23.1 (ref 0.0–30.0)
Aldosterone: 15.5 ng/dL (ref 0.0–30.0)
Renin Activity, Plasma: 0.671 ng/mL/h (ref 0.167–5.380)

## 2023-05-04 ENCOUNTER — Encounter (HOSPITAL_BASED_OUTPATIENT_CLINIC_OR_DEPARTMENT_OTHER): Payer: Self-pay

## 2023-05-04 DIAGNOSIS — L2989 Other pruritus: Secondary | ICD-10-CM | POA: Diagnosis not present

## 2023-05-04 DIAGNOSIS — L820 Inflamed seborrheic keratosis: Secondary | ICD-10-CM | POA: Diagnosis not present

## 2023-05-04 DIAGNOSIS — L538 Other specified erythematous conditions: Secondary | ICD-10-CM | POA: Diagnosis not present

## 2023-05-04 DIAGNOSIS — Z08 Encounter for follow-up examination after completed treatment for malignant neoplasm: Secondary | ICD-10-CM | POA: Diagnosis not present

## 2023-05-04 DIAGNOSIS — R208 Other disturbances of skin sensation: Secondary | ICD-10-CM | POA: Diagnosis not present

## 2023-05-04 DIAGNOSIS — D225 Melanocytic nevi of trunk: Secondary | ICD-10-CM | POA: Diagnosis not present

## 2023-05-04 DIAGNOSIS — K080 Exfoliation of teeth due to systemic causes: Secondary | ICD-10-CM | POA: Diagnosis not present

## 2023-05-04 DIAGNOSIS — L821 Other seborrheic keratosis: Secondary | ICD-10-CM | POA: Diagnosis not present

## 2023-05-04 DIAGNOSIS — L814 Other melanin hyperpigmentation: Secondary | ICD-10-CM | POA: Diagnosis not present

## 2023-05-08 ENCOUNTER — Encounter (HOSPITAL_BASED_OUTPATIENT_CLINIC_OR_DEPARTMENT_OTHER): Payer: Self-pay | Admitting: Cardiovascular Disease

## 2023-05-08 DIAGNOSIS — I10 Essential (primary) hypertension: Secondary | ICD-10-CM | POA: Insufficient documentation

## 2023-05-08 DIAGNOSIS — E7800 Pure hypercholesterolemia, unspecified: Secondary | ICD-10-CM | POA: Insufficient documentation

## 2023-05-10 DIAGNOSIS — R739 Hyperglycemia, unspecified: Secondary | ICD-10-CM | POA: Diagnosis not present

## 2023-05-10 DIAGNOSIS — E7800 Pure hypercholesterolemia, unspecified: Secondary | ICD-10-CM | POA: Diagnosis not present

## 2023-05-11 ENCOUNTER — Other Ambulatory Visit (HOSPITAL_BASED_OUTPATIENT_CLINIC_OR_DEPARTMENT_OTHER): Payer: Self-pay | Admitting: *Deleted

## 2023-05-11 DIAGNOSIS — I10 Essential (primary) hypertension: Secondary | ICD-10-CM

## 2023-05-11 NOTE — Telephone Encounter (Signed)
-----   Message from [facility] sent at 05/04/2023 10:03 AM EST ----- Lab are indeterminate for hyperaldosteronism as the cause for difficult to control BP.  We need to do confirmatory testing.  We need to salt load with sodium chloride tablet 2g tid x3 days. On the third day of the high-sodium diet, need to get a BMP a 24-hour urine specimen for aldosterone, sodium, and creatinine.

## 2023-05-12 ENCOUNTER — Encounter (HOSPITAL_BASED_OUTPATIENT_CLINIC_OR_DEPARTMENT_OTHER): Payer: Self-pay | Admitting: *Deleted

## 2023-05-12 MED ORDER — SODIUM CHLORIDE 1 G PO TABS: 2.0000 g | ORAL_TABLET | Freq: Three times a day (TID) | ORAL | 0 refills | Status: DC

## 2023-05-12 NOTE — Telephone Encounter (Signed)
 Patient aware of recommendations.

## 2023-05-15 ENCOUNTER — Ambulatory Visit (HOSPITAL_BASED_OUTPATIENT_CLINIC_OR_DEPARTMENT_OTHER): Payer: Medicare Other

## 2023-05-15 DIAGNOSIS — I10 Essential (primary) hypertension: Secondary | ICD-10-CM | POA: Diagnosis not present

## 2023-05-15 MED ORDER — HYDROCHLOROTHIAZIDE 25 MG PO TABS
25.0000 mg | ORAL_TABLET | Freq: Every day | ORAL | 0 refills | Status: DC
  Filled 2023-05-15: qty 8, 8d supply, fill #0

## 2023-05-16 ENCOUNTER — Other Ambulatory Visit (HOSPITAL_BASED_OUTPATIENT_CLINIC_OR_DEPARTMENT_OTHER): Payer: Self-pay | Admitting: *Deleted

## 2023-05-16 ENCOUNTER — Other Ambulatory Visit (HOSPITAL_BASED_OUTPATIENT_CLINIC_OR_DEPARTMENT_OTHER): Payer: Self-pay

## 2023-05-16 DIAGNOSIS — I10 Essential (primary) hypertension: Secondary | ICD-10-CM

## 2023-05-16 NOTE — Telephone Encounter (Signed)
 Late entry-spoke with patient 2/24 and reviewed changes  Patient verbalized understanding

## 2023-05-23 ENCOUNTER — Encounter (HOSPITAL_BASED_OUTPATIENT_CLINIC_OR_DEPARTMENT_OTHER): Payer: Self-pay | Admitting: Pharmacist Clinician (PhC)/ Clinical Pharmacy Specialist

## 2023-05-23 ENCOUNTER — Other Ambulatory Visit (HOSPITAL_BASED_OUTPATIENT_CLINIC_OR_DEPARTMENT_OTHER): Payer: Self-pay

## 2023-05-23 ENCOUNTER — Ambulatory Visit (INDEPENDENT_AMBULATORY_CARE_PROVIDER_SITE_OTHER): Payer: Medicare Other | Admitting: Pharmacist Clinician (PhC)/ Clinical Pharmacy Specialist

## 2023-05-23 VITALS — BP 178/82 | HR 52 | Ht 76.0 in | Wt 241.4 lb

## 2023-05-23 DIAGNOSIS — I10 Essential (primary) hypertension: Secondary | ICD-10-CM | POA: Diagnosis not present

## 2023-05-23 MED ORDER — HYDROCHLOROTHIAZIDE 25 MG PO TABS
25.0000 mg | ORAL_TABLET | Freq: Every day | ORAL | 0 refills | Status: DC
  Filled 2023-05-23: qty 8, 8d supply, fill #0

## 2023-05-23 NOTE — Assessment & Plan Note (Signed)
 Assessment: BP is un/controlled in office BP 179/80 mmHg;  above the goal (<130/80). Tolerates carvedilol and telmisartan well without any side effects Denies SOB, palpitation, chest pain, headaches,or swelling Reiterated the importance of regular exercise and low salt diet   Plan:  Continue taking telmisartan hctz 80/25 every day and carvedilol 25 bid Go up to the lab and get urine container for salt load test Patient to keep record of BP readings with heart rate and report to [place] at the next visit Patient to follow up with PharmD in 1 month  Labs ordered today:  none

## 2023-05-23 NOTE — Patient Instructions (Signed)
 Follow up appointment: Monday April 14 at 9:30  Do the salt load and urine collection in the next few weeks  Take your BP meds as follows:  Check your blood pressure at home daily (if able) and keep record of the readings.  Your blood pressure goal is < 130/80  To check your pressure at home you will need to:  1. Sit up in a chair, with feet flat on the floor and back supported. Do not cross your ankles or legs. 2. Rest your left arm so that the cuff is about heart level. If the cuff goes on your upper arm,  then just relax the arm on the table, arm of the chair or your lap. If you have a wrist cuff, we  suggest relaxing your wrist against your chest (think of it as Pledging the Flag with the  wrong arm).  3. Place the cuff snugly around your arm, about 1 inch above the crook of your elbow. The  cords should be inside the groove of your elbow.  4. Sit quietly, with the cuff in place, for about 5 minutes. After that 5 minutes press the power  button to start a reading. 5. Do not talk or move while the reading is taking place.  6. Record your readings on a sheet of paper. Although most cuffs have a memory, it is often  easier to see a pattern developing when the numbers are all in front of you.  7. You can repeat the reading after 1-3 minutes if it is recommended  Make sure your bladder is empty and you have not had caffeine or tobacco within the last 30 min  Always bring your blood pressure log with you to your appointments. If you have not brought your monitor in to be double checked for accuracy, please bring it to your next appointment.  You can find a list of quality blood pressure cuffs at [email or web address]  Important lifestyle changes to control high blood pressure  Intervention  Effect on the BP  Lose extra pounds and watch your waistline Weight loss is one of the most effective lifestyle changes for controlling blood pressure. If you're overweight or obese, losing even a small  amount of weight can help reduce blood pressure. Blood pressure might go down by about 1 millimeter of mercury (mm Hg) with each kilogram (about 2.2 pounds) of weight lost.  Exercise regularly As a general goal, aim for at least 30 minutes of moderate physical activity every day. Regular physical activity can lower high blood pressure by about 5 to 8 mm Hg.  Eat a healthy diet Eating a diet rich in whole grains, fruits, vegetables, and low-fat dairy products and low in saturated fat and cholesterol. A healthy diet can lower high blood pressure by up to 11 mm Hg.  Reduce salt (sodium) in your diet Even a small reduction of sodium in the diet can improve heart health and reduce high blood pressure by about 5 to 6 mm Hg.  Limit alcohol One drink equals 12 ounces of beer, 5 ounces of wine, or 1.5 ounces of 80-proof liquor.  Limiting alcohol to less than one drink a day for women or two drinks a day for men can help lower blood pressure by about 4 mm Hg.   If you have any questions or concerns please use My Chart to send questions or call the office at [phone]

## 2023-05-23 NOTE — Progress Notes (Signed)
 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     History of Present Illness    Gerald Wolfe is a 68 y.o. male patient who was referred to the [facility] by Dr. Mila Palmer.  Dr. Duke Salvia saw him in January, at which time his pressure was 190/88 on telmisartan, hctz and metoprolol.  She switched the metoprolol to carvedilol and ordered renin/aldosterone labs.  This came back questionable for hyperaldosteronism, so a salt load and 24 hour urine were ordered.    Today he returns for follow up .  He has cut back on the meloxicam from daily to 2-3 times per week  He will need to stop telmisartan x 5 days and just take hctz, then do salt load x 3 days and 24 hr urine collection.  He will then resume the telmisartan hctz.    Blood Pressure Goal:  130/80  Current Medications: carvedilol 25 mg bid, telmisartan hctz 80/25,   Family Hx:   mother had hypertension, lost significant weight and stopped taking; younger sister with HTN (obese); brother with hypertension/hypotension;   Social Hx:      Tobacco:  no  Alcohol: 2-4 days per week, mostly vodka or wine  Caffeine: coffee in the mornings 2 cups  Diet:   rarely eats out, maybe once a month; cooks from scratch mostly; protein mostly chicken and fish, some beef; vegetables daily; not significantly snacking; has adjusted diet over past month or so to loose weight, improve cholesterol (down 10 pounds)   Exercise: gym every other day [employer]; some cardio (limited recently 2/2 leg problems), also some weight/resistance   Home BP readings: home devices (arm) - has 2, about 8-10, both within a few points  (one is Omron)  AM 12 readings average 132/70  HR 57  (range 121-142/60-82)  PM 10 readings average 132/68  HR 67  (range 128/139/61/77)   Accessory Clinical Findings    Lab Results  Component Value Date   CREATININE 1.30 (H) 03/12/2015   BUN 22 (H) 03/12/2015   NA 138 03/12/2015   K 4.1 03/12/2015   CL 102 03/12/2015   CO2 26 03/12/2015   No results found for: "ALT", "AST", "GGT", "ALKPHOS", "BILITOT" No results found for: "HGBA1C"  Screening for Secondary Hypertension:      04/21/2023    2:11 PM  Causes  Drugs/Herbals Screened     - Comments limits sodium intake.  cooks at home. 2-3 alcoholic drinks 4 days per week.  2-3 coffees daily. No NSAIDS.  Renovascular HTN Screened     - Comments Check renal Dopplers  Thyroid Disease  Screened  Hyperaldosteronism Screened     - Comments check renin and aldosterone  Pheochromocytoma Screened     - Comments no palpitations  Cushing's Syndrome N/A  Hyperparathyroidism Screened  Coarctation of the Aorta Screened     - Comments BP symmetric  Compliance Screened    Relevant Labs/Studies:    Latest Ref Rng & Units 03/12/2015    4:18 AM 03/06/2015    9:25 AM 01/23/2015   10:37 AM  Basic Labs  Sodium 135 - 145 mmol/L 138  141  145   Potassium 3.5 - 5.1 mmol/L 4.1  4.0  3.4   Creatinine 0.61 - 1.24 mg/dL 1.61  0.96  0.45           Latest Ref Rng & Units 04/25/2023    9:08 AM   Renin/Aldosterone   Aldosterone 0.0 - 30.0 ng/dL 40.9   Aldos/Renin Ratio 0.0 - 30.0 23.1              05/15/2023   11:02 AM  Renovascular   Renal Artery [place] Completed Yes      Home Medications    Current Outpatient Medications  Medication Sig Dispense Refill   ALPRAZolam (XANAX) 0.5 MG tablet Take 0.5 mg by mouth at bedtime as needed for sleep.   1   carvedilol (COREG) 25 MG tablet Take 1 tablet (25 mg total) by mouth 2 (two) times daily. 180 tablet 3   fluticasone (FLONASE) 50 MCG/ACT nasal spray Place 1 spray into both nostrils daily as needed for allergies or rhinitis.     hydrochlorothiazide (HYDRODIURIL) 25 MG tablet Take 25 mg by mouth daily. Take for 5 days prior to and 3 days during salt load test     Loratadine (CLARITIN PO) Take 1 tablet by mouth daily.     meloxicam (MOBIC) 15 MG tablet Take 15 mg by mouth daily.     telmisartan-hydrochlorothiazide (MICARDIS HCT) 80-25 MG tablet Take 1 tablet by mouth daily.     Fluorouracil (TOLAK) 4 % CREA Apply to affected area qhs x 25-28 applications (Patient not taking: Reported on 05/23/2023) 40 g 1   sodium chloride 1 g tablet Take 2 tablets (2 g total) by mouth 3 (three) times daily. (Patient not taking: Reported on 05/23/2023) 18 tablet 0   No current facility-administered medications for this visit.     Assessment & Plan   HYPERTENSION CONTROL Vitals:   05/23/23 1525 05/23/23 1529  BP: (!) 179/80 (!) 178/82    The patient's blood pressure is elevated above target today.  In order to address the patient's elevated BP: Blood pressure will be monitored at home to determine if medication changes need to be made.; The blood pressure is usually elevated in clinic.  Blood pressures monitored at home have been optimal.     Primary hypertension Assessment: BP is un/controlled in office BP 179/80 mmHg;  above the goal (<130/80). Tolerates carvedilol and telmisartan well without any side effects Denies SOB, palpitation, chest  pain, headaches,or swelling Reiterated the importance of regular exercise and low salt diet   Plan:  Continue taking telmisartan hctz 80/25 every day and carvedilol 25 bid Go up to the lab and get urine container for salt load test Patient to keep record of BP readings with heart rate and report to [place] at the next visit Patient to follow up with PharmD in 1 month  Labs ordered today:  none   Phillips Hay PharmD CPP [facility]  [address]  [address] [place], [place] [address] [phone]

## 2023-05-25 ENCOUNTER — Encounter (HOSPITAL_BASED_OUTPATIENT_CLINIC_OR_DEPARTMENT_OTHER): Payer: Self-pay

## 2023-05-25 ENCOUNTER — Other Ambulatory Visit (HOSPITAL_BASED_OUTPATIENT_CLINIC_OR_DEPARTMENT_OTHER): Payer: Self-pay | Admitting: *Deleted

## 2023-05-25 DIAGNOSIS — I701 Atherosclerosis of renal artery: Secondary | ICD-10-CM

## 2023-06-01 ENCOUNTER — Other Ambulatory Visit (HOSPITAL_BASED_OUTPATIENT_CLINIC_OR_DEPARTMENT_OTHER): Payer: Self-pay | Admitting: *Deleted

## 2023-06-01 DIAGNOSIS — I10 Essential (primary) hypertension: Secondary | ICD-10-CM

## 2023-06-01 LAB — BASIC METABOLIC PANEL
BUN/Creatinine Ratio: 14 (ref 10–24)
BUN: 16 mg/dL (ref 8–27)
CO2: 26 mmol/L (ref 20–29)
Calcium: 9 mg/dL (ref 8.6–10.2)
Chloride: 102 mmol/L (ref 96–106)
Creatinine, Ser: 1.12 mg/dL (ref 0.76–1.27)
Glucose: 96 mg/dL (ref 70–99)
Potassium: 3.8 mmol/L (ref 3.5–5.2)
Sodium: 143 mmol/L (ref 134–144)
eGFR: 72 mL/min/{1.73_m2} (ref >59)

## 2023-06-07 LAB — ALDOSTERONE, URINE
Aldosterone U,Random: 4.24 ug/L
Aldosterone, 24H Ur: 10.6 ug/(24.h) (ref 0.00–19.00)

## 2023-06-07 LAB — SODIUM, URINE, 24 HOUR
Sodium, 24H Ur: 295 mmol/(24.h) (ref 58–337)
Sodium, Ur: 118 mmol/L

## 2023-06-07 LAB — CREATINE, 24-HOUR URINE
Creat Conc: 0.1 mg/dL
Creatinine, 24H Ur: 3 mg/(24.h) (ref 0–40)

## 2023-07-02 ENCOUNTER — Encounter (HOSPITAL_BASED_OUTPATIENT_CLINIC_OR_DEPARTMENT_OTHER): Payer: Self-pay | Admitting: Cardiovascular Disease

## 2023-07-03 ENCOUNTER — Ambulatory Visit (HOSPITAL_BASED_OUTPATIENT_CLINIC_OR_DEPARTMENT_OTHER): Admitting: Pharmacist Clinician (PhC)/ Clinical Pharmacy Specialist

## 2023-07-03 ENCOUNTER — Encounter (HOSPITAL_BASED_OUTPATIENT_CLINIC_OR_DEPARTMENT_OTHER): Payer: Self-pay | Admitting: Pharmacist Clinician (PhC)/ Clinical Pharmacy Specialist

## 2023-07-03 VITALS — BP 147/76 | HR 55

## 2023-07-03 DIAGNOSIS — I10 Essential (primary) hypertension: Secondary | ICD-10-CM | POA: Diagnosis not present

## 2023-07-03 NOTE — Patient Instructions (Signed)
 Follow up appointment: with Dr. Theodis Fiscal on May 30 at 9:15 am  Take your BP meds as follows:  Cut carvedilol tablets in half and take 12.5 mg twice daily.  After 3 weeks you can average the last 14 days and send that information to me.    Check your blood pressure at home daily (if able) and keep record of the readings.  Your blood pressure goal is <130/80  To check your pressure at home you will need to:  1. Sit up in a chair, with feet flat on the floor and back supported. Do not cross your ankles or legs. 2. Rest your left arm so that the cuff is about heart level. If the cuff goes on your upper arm,  then just relax the arm on the table, arm of the chair or your lap. If you have a wrist cuff, we  suggest relaxing your wrist against your chest (think of it as Pledging the Flag with the  wrong arm).  3. Place the cuff snugly around your arm, about 1 inch above the crook of your elbow. The  cords should be inside the groove of your elbow.  4. Sit quietly, with the cuff in place, for about 5 minutes. After that 5 minutes press the power  button to start a reading. 5. Do not talk or move while the reading is taking place.  6. Record your readings on a sheet of paper. Although most cuffs have a memory, it is often  easier to see a pattern developing when the numbers are all in front of you.  7. You can repeat the reading after 1-3 minutes if it is recommended  Make sure your bladder is empty and you have not had caffeine or tobacco within the last 30 min  Always bring your blood pressure log with you to your appointments. If you have not brought your monitor in to be double checked for accuracy, please bring it to your next appointment.  You can find a list of quality blood pressure cuffs at [email or web address]  Important lifestyle changes to control high blood pressure  Intervention  Effect on the BP  Lose extra pounds and watch your waistline Weight loss is one of the most effective  lifestyle changes for controlling blood pressure. If you're overweight or obese, losing even a small amount of weight can help reduce blood pressure. Blood pressure might go down by about 1 millimeter of mercury (mm Hg) with each kilogram (about 2.2 pounds) of weight lost.  Exercise regularly As a general goal, aim for at least 30 minutes of moderate physical activity every day. Regular physical activity can lower high blood pressure by about 5 to 8 mm Hg.  Eat a healthy diet Eating a diet rich in whole grains, fruits, vegetables, and low-fat dairy products and low in saturated fat and cholesterol. A healthy diet can lower high blood pressure by up to 11 mm Hg.  Reduce salt (sodium) in your diet Even a small reduction of sodium in the diet can improve heart health and reduce high blood pressure by about 5 to 6 mm Hg.  Limit alcohol One drink equals 12 ounces of beer, 5 ounces of wine, or 1.5 ounces of 80-proof liquor.  Limiting alcohol to less than one drink a day for women or two drinks a day for men can help lower blood pressure by about 4 mm Hg.   If you have any questions or concerns please use My Chart to send  questions or call the office at [phone]

## 2023-07-03 NOTE — Assessment & Plan Note (Signed)
 Assessment: BP is uncontrolled in office BP 147/76 mmHg;  above the goal (<130/80). Home cuff accurate to within 10 points Tolerates carvedilol, telmisartan/hctz well without any side effects Denies SOB, palpitation, chest pain, headaches,or swelling Feels some fatigue when first gets started in the day, does well overall  Reiterated the importance of regular exercise and low salt diet   Plan:  Cut carvedilol dose to 12.5 mg twice daily - see if this helps with energy level and lower HR readings If BP readings increase over next 3-4 weeks, will need to go back to 25 mg bid.  Continue taking telmisartan/hctz 80/25 daily Patient to keep record of BP readings with heart rate and report to us  at the next visit Patient to follow up with Dr. Theodis Fiscal on May 30  Labs ordered today:

## 2023-07-03 NOTE — Progress Notes (Signed)
 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     History of Present Illness    Gerald Wolfe is a 68 y.o. male patient who was referred to the [facility] by Dr. Mila Palmer.  Dr. Duke Salvia saw him in January, at which time his pressure was 190/88 on telmisartan, hctz and metoprolol.  She switched the metoprolol to carvedilol and ordered renin/aldosterone labs.  This came back questionable for hyperaldosteronism, so a salt load and 24 hour urine were ordered.  These were not indicative of hyperaldosteronism.  Today he returns for follow up.   Feels a little more lethargic with these meds.  Sluggish to start, then fine throught th day.  He admits to ~5 pound weight gain recently after a week in [place], but is trying to get that back off.  He also notes that when he stopped his telmisartan for 5 days to do the urine aldosterone lab, his blood pressure did not change significantly.  Blood Pressure Goal:  130/80  Current Medications: carvedilol 25 mg bid, telmisartan hctz 80/25 every day am,   Family Hx:    mother had hypertension, lost significant weight and stopped taking; younger sister with HTN (obese); brother with hypertension/hypotension;   Social Hx:      Tobacco: no  Alcohol: 2-4 days per week, mostly vodka or wine  Caffeine: coffee in the mornings 2 cups  Diet:   rarely eats out, maybe once a month; cooks from scratch mostly; protein mostly chicken and fish, some beef; vegetables daily; not significantly snacking; has adjusted diet over past month or so to loose weight, improve cholesterol (down 10 pounds)   Exercise: gym every other day [employer]; limited cardio (2/2 leg problems), also some weight/resistance; started walking 1.25 mi  Home BP readings: home Omron cuff reads within 5 points systolic (diastolic read ~15 points higher)  Omron 165  - 166/93  AM 15 readings average 129/69  HR 59  (range 121-144/55-78)  PM 14 readings average 138/72  HR 62  (range 121-149/60-82)   Accessory Clinical Findings    Lab Results  Component Value Date   CREATININE 1.12 06/01/2023   BUN 16 06/01/2023   NA 143 06/01/2023   K 3.8 06/01/2023   CL 102 06/01/2023   CO2 26 06/01/2023   No results found for: "ALT", "AST", "GGT", "ALKPHOS", "BILITOT" No results found for: "HGBA1C"  Screening for Secondary Hypertension:      04/21/2023    2:11 PM  Causes  Drugs/Herbals Screened     - Comments limits sodium intake.  cooks at home. 2-3 alcoholic drinks 4 days  per week.  2-3 coffees daily. No NSAIDS.  Renovascular HTN Screened     - Comments Check renal Dopplers  Thyroid Disease Screened  Hyperaldosteronism Screened     - Comments check renin and aldosterone  Pheochromocytoma Screened     - Comments no palpitations  Cushing's Syndrome N/A  Hyperparathyroidism Screened  Coarctation of the Aorta Screened     - Comments BP symmetric  Compliance Screened    Relevant Labs/Studies:    Latest Ref Rng & Units 06/01/2023   10:07 AM 03/12/2015    4:18 AM 03/06/2015    9:25 AM  Basic  Labs  Sodium 134 - 144 mmol/L 143  138  141   Potassium 3.5 - 5.2 mmol/L 3.8  4.1  4.0   Creatinine 0.76 - 1.27 mg/dL 1.61  0.96  0.45           Latest Ref Rng & Units 04/25/2023    9:08 AM  Renin/Aldosterone   Aldosterone 0.0 - 30.0 ng/dL 40.9   Aldos/Renin Ratio 0.0 - 30.0 23.1              05/25/2023   11:10 AM  Renovascular   Renal Artery US  Completed Yes      Home Medications    Current Outpatient Medications  Medication Sig Dispense Refill   ALPRAZolam (XANAX) 0.5 MG tablet Take 0.5 mg by mouth at bedtime as needed for sleep.   1   fluticasone (FLONASE) 50 MCG/ACT nasal spray Place 1 spray into both nostrils daily as needed for allergies or rhinitis.     Loratadine (CLARITIN PO) Take 1 tablet by mouth daily.     meloxicam (MOBIC) 15 MG tablet Take 15 mg by mouth daily.     telmisartan-hydrochlorothiazide (MICARDIS HCT) 80-25 MG tablet Take 1 tablet by mouth daily.     No current facility-administered medications for this visit.     Assessment & Plan   HYPERTENSION CONTROL Vitals:   07/03/23 0941 07/03/23 0945  BP: (!) 168/77 (!) 147/76    The patient's blood pressure is elevated above target today.  In order to address the patient's elevated BP: A current anti-hypertensive medication was adjusted today.     Primary hypertension Assessment: BP is uncontrolled in office BP 147/76 mmHg;  above the goal (<130/80). Home cuff accurate to within 10 points Tolerates carvedilol, telmisartan/hctz well without any side effects Denies SOB, palpitation, chest pain, headaches,or swelling Feels some fatigue when first gets started in the day, does well overall  Reiterated the importance of regular exercise and low salt diet   Plan:  Cut carvedilol dose to 12.5 mg twice daily - see if this helps with energy level and lower HR readings If BP readings increase over next 3-4 weeks, will need to go back to 25 mg bid.  Continue taking telmisartan/hctz 80/25 daily Patient to  keep record of BP readings with heart rate and report to us  at the next visit Patient to follow up with Dr. Theodis Fiscal on May 30  Labs ordered today:     Neythan Kozlov PharmD CPP [facility]  [address] [place], [place] [address] [phone]

## 2023-07-21 ENCOUNTER — Encounter: Payer: Self-pay | Admitting: Pharmacist Clinician (PhC)/ Clinical Pharmacy Specialist

## 2023-08-01 DIAGNOSIS — I10 Essential (primary) hypertension: Secondary | ICD-10-CM | POA: Diagnosis not present

## 2023-08-01 DIAGNOSIS — L03019 Cellulitis of unspecified finger: Secondary | ICD-10-CM | POA: Diagnosis not present

## 2023-08-01 DIAGNOSIS — I701 Atherosclerosis of renal artery: Secondary | ICD-10-CM | POA: Diagnosis not present

## 2023-08-01 DIAGNOSIS — F331 Major depressive disorder, recurrent, moderate: Secondary | ICD-10-CM | POA: Diagnosis not present

## 2023-08-07 ENCOUNTER — Ambulatory Visit (HOSPITAL_BASED_OUTPATIENT_CLINIC_OR_DEPARTMENT_OTHER): Payer: Self-pay | Admitting: Cardiovascular Disease

## 2023-08-18 ENCOUNTER — Encounter (HOSPITAL_BASED_OUTPATIENT_CLINIC_OR_DEPARTMENT_OTHER): Payer: Self-pay | Admitting: Cardiovascular Disease

## 2023-08-18 ENCOUNTER — Ambulatory Visit (HOSPITAL_BASED_OUTPATIENT_CLINIC_OR_DEPARTMENT_OTHER): Payer: Medicare Other | Admitting: Cardiovascular Disease

## 2023-08-18 VITALS — BP 136/72 | HR 52 | Ht 76.0 in | Wt 241.6 lb

## 2023-08-18 DIAGNOSIS — I10 Essential (primary) hypertension: Secondary | ICD-10-CM | POA: Diagnosis not present

## 2023-08-18 NOTE — Progress Notes (Signed)
 Advanced Hypertension [facility] Initial Assessment:    Date:  08/18/2023   ID:  Gerald Wolfe, DOB [birth year], MRN [id number]  PCP:  Olin Bertin, MD  Cardiologist:  None  Nephrologist:  Referring MD: Olin Bertin, MD   CC: Hypertension  History of Present Illness:    Gerald Wolfe is a 68 y.o. male with a hx of hypertension, hyperlipidemia, mild renal artery stenosis, and anxiety here to establish care in the [facility].  He has been working with Dr. Alexia Angelucci and struggled to control blood pressure despite being on telmisartan/HCTZ and metoprolol .  He was last seen 01/2023 and blood pressure was 160/75 so he was referred to the advanced hypertension clinic.  At his visit 03/2023 he reported that his blood pressure was much better controlled when at a lower weight.  At his initial visit blood pressure was 190/88.  He was working to lose weight again with diet and exercise.  He noted that he drinks 2 to 3 cups of coffee daily.  He was referred for sleep study.  Metoprolol  was switched to carvedilol .  He followed up with our pharmacist, Ponciano Bristle, 05/2023 and was cutting back on meloxicam  and caffeine.  Labs were indeterminate for hyperaldosteronism.  However they were negative on 24-hour urine collection.  He noted feeling more sluggish on carvedilol .  Blood pressure remained uncontrolled but improved.  Carvedilol  was reduced.  Renal Dopplers revealed mild stenosis on the right.  Discussed the use of AI scribe software for clinical note transcription with the patient, who gave verbal consent to proceed.  History of Present Illness Gerald Wolfe's blood pressure readings at home have improved. He takes carvedilol  12.5 mg twice daily and telmisartan hydrochlorothiazide  in the morning. His heart rate has been in the 50s to 60s, and he experienced sluggishness when his heart rate was lower while on a higher dose of carvedilol .  He has been on rosuvastatin for about  a month, taking it every other day due to previous experiences with statins causing leg pain. He had previously managed to lower his cholesterol without medication but has resumed statin therapy due to elevated levels. No current leg pain from statins.  His knees have improved with warmer weather, allowing him to walk daily and visit the gym every other day. He uses the pool occasionally but has not significantly increased his activity level.  He sleeps 5 to 6.5 hours per night, noting better rest with 7 to 7.5 hours. He attributes his current sleep pattern to staying up late watching TV and waking early when his wife rises.  He has a family history of high blood pressure but minimal heart disease. His brother, aged 13, is not on blood pressure medication. He also mentions having had prostate cancer, which is not common in his family.   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   Pure hypercholesterolemia 05/08/2023    Past Surgical History:  Procedure  Laterality Date   COLONOSCOPY     FOOT SURGERY Left 2013   debridement/ removal spur   LYMPHADENECTOMY Bilateral 03/11/2015   Procedure: LYMPHADENECTOMY;  Surgeon: Osborn Blaze, MD;  Location: [facility];  Service: Urology;  Laterality: Bilateral;   PROSTATE BIOPSY N/A 01/23/2015   Procedure: SATURATED BIOPSY TRANSRECTAL ULTRASONIC PROSTATE (TUBP);  Surgeon: Marco Severs, MD;   Location: [facility];  Service: Urology;  Laterality: N/A;   ROBOT ASSISTED LAPAROSCOPIC RADICAL PROSTATECTOMY N/A 03/11/2015   Procedure: ROBOTIC ASSISTED LAPAROSCOPIC RADICAL PROSTATECTOMY WITH INDOCYANINE  GREEN DYE;  Surgeon: Osborn Blaze, MD;  Location: [facility];  Service: Urology;  Laterality: N/A;    Current Medications: Current Meds  Medication Sig   ALPRAZolam  (XANAX ) 0.5 MG tablet Take 0.5 mg by mouth at bedtime as needed for sleep.    carvedilol  (COREG ) 25 MG tablet Take 12.5 mg by mouth 2 (two) times daily with a meal.   Coenzyme Q10 (CO Q 10) 100 MG CAPS 1 capsule.   fluticasone (FLONASE) 50 MCG/ACT nasal spray Place 1 spray into both nostrils daily as needed for allergies or rhinitis.   Loratadine (CLARITIN PO) Take 1 tablet by mouth daily.   meloxicam  (MOBIC ) 15 MG tablet Take 15 mg by mouth daily.   Multiple Vitamins-Minerals (MULTIVITAL PO) Take by mouth.   rosuvastatin (CRESTOR) 20 MG tablet Take 20 mg by mouth every other day.   telmisartan-hydrochlorothiazide  (MICARDIS HCT) 80-25 MG tablet Take 1 tablet by mouth daily.     Allergies:   Penicillins   Social History   Socioeconomic History   Marital status: Married    Spouse name: Not on file   Number of children: Not on file   Years of education: Not on file   Highest education level: Not on file  Occupational History   Not on file  Tobacco Use   Smoking status: Former    Current packs/day: 0.00    Types: Cigarettes    Start date: 01/15/1970    Quit date: 01/15/1977    Years since quitting: 46.6   Smokeless tobacco: Never  Substance and Sexual Activity   Alcohol use: Yes    Alcohol/week: 5.0 - 7.0 standard drinks of alcohol    Types: 5 - 7 Shots of liquor per week    Comment: 4-8 drinks per week   Drug use: No   Sexual activity: Not on file  Other Topics Concern   Not on file  Social History Narrative   Not on file   Social Drivers of Health   Financial Resource Strain: Not on  file  Food Insecurity: Not on file  Transportation Needs: Not on file  Physical Activity: Not on file  Stress: Not on file  Social Connections: Not on file     Family History: The patient's family history includes Allergies in his sister; Cancer in his father and sister; Clotting disorder in his mother; Hypertension in his brother and sister; Kidney disease in his brother.  ROS:   Please see the history of present illness.     All other systems reviewed and are negative.  EKGs/Labs/Other Studies Reviewed:    EKG:  EKG is  ordered today.   EKG Interpretation Date/Time:    Ventricular Rate:    PR Interval:    QRS Duration:    QT Interval:    QTC Calculation:   R Axis:      Text Interpretation:          Recent Labs:  06/01/2023: BUN 16; Creatinine, Ser 1.12; Potassium 3.8; Sodium 143   05/10/23; Total cholesterol 207, HDL 48, LDL 147, HDL 48  Recent Lipid Panel No results found for: "CHOL", "TRIG", "HDL", "CHOLHDL", "VLDL", "LDLCALC", "LDLDIRECT"  Physical Exam:   VS:  BP 136/72   Pulse (!) 52   Ht 6\' 4"  (1.93 m)   Wt 241 lb 9.6 oz (109.6 kg)   SpO2 98%   BMI 29.41 kg/m  , BMI Body mass index is 29.41 kg/m. GENERAL:  Well appearing HEENT: Pupils equal round and reactive, fundi not visualized, oral mucosa unremarkable NECK:  No jugular venous distention, waveform within normal limits, carotid upstroke brisk and symmetric, no bruits, no thyromegaly LUNGS:  Clear to auscultation bilaterally HEART:  RRR.  PMI not displaced or sustained,S1 and S2 within normal limits, no S3, no S4, no clicks, no rubs, no murmurs ABD:  Flat, positive bowel sounds normal in frequency in pitch, no bruits, no rebound, no guarding, no midline pulsatile mass, no hepatomegaly, no splenomegaly EXT:  2 plus pulses throughout, no edema, no cyanosis no clubbing SKIN:  No rashes no nodules NEURO:  Cranial nerves II through XII grossly intact, motor grossly intact throughout PSYCH:  Cognitively  intact, oriented to person place and time   ASSESSMENT/PLAN:    Assessment & Plan # Hypertension Blood pressure improved, nearing target 130/80 mmHg. Initially elevated in the office and better on repeat.  Current regimen effective and better controlled at home. - Continue telmisartan hydrochlorothiazide  and carvedilol . - Encourage regular exercise and salt intake monitoring. - Reassess blood pressure in six months.  # Hyperlipidemia Cholesterol levels are elevated. Rosuvastatin appropriately initiated with alternate-day dosing due to myalgia. Discussed calcium score for risk assessment and treatment guidance. - Continue rosuvastatin every other day. - Provided information on calcium score for cardiovascular risk assessment. - Consider calcium score if statin intolerance persists or cholesterol levels remain elevated after June labs.  Screening for Secondary Hypertension:     04/21/2023    2:11 PM  Causes  Drugs/Herbals Screened     - Comments limits sodium intake.  cooks at home. 2-3 alcoholic drinks 4 days per week.  2-3 coffees daily. No NSAIDS.  Renovascular HTN Screened     - Comments Check renal Dopplers  Thyroid  Disease Screened  Hyperaldosteronism Screened     - Comments check renin and aldosterone  Pheochromocytoma Screened     - Comments no palpitations  Cushing's Syndrome N/A  Hyperparathyroidism Screened  Coarctation of the Aorta Screened     - Comments BP symmetric  Compliance Screened    Relevant Labs/Studies:    Latest Ref Rng & Units 06/01/2023   10:07 AM 03/12/2015    4:18 AM 03/06/2015    9:25 AM  Basic Labs  Sodium 134 - 144 mmol/L 143  138  141   Potassium 3.5 - 5.2 mmol/L 3.8  4.1  4.0   Creatinine 0.76 - 1.27 mg/dL 0.86  5.78  4.69           Latest Ref Rng & Units 04/25/2023    9:08 AM  Renin/Aldosterone   Aldosterone 0.0 - 30.0 ng/dL 62.9   Aldos/Renin Ratio 0.0 - 30.0 23.1              05/25/2023   11:10 AM  Renovascular   Renal Artery  US  Completed Yes      Disposition:    FU with MD/PharmD in 6  months   Medication Adjustments/Labs and Tests Ordered:  Current medicines are reviewed at length with the patient today.  Concerns regarding medicines are outlined above.  No orders of the defined types were placed in this encounter.  No orders of the defined types were placed in this encounter.    Signed, Maudine Sos, MD  08/18/2023 10:14 AM    [facility]

## 2023-08-18 NOTE — Patient Instructions (Signed)
 Medication Instructions:  Your physician recommends that you continue on your current medications as directed. Please refer to the Current Medication list given to you today.   Follow-Up:  Please follow up in 6 months with Dr. Theodis Fiscal, Slater Duncan, NP or Neomi Banks, NP

## 2023-09-08 DIAGNOSIS — J208 Acute bronchitis due to other specified organisms: Secondary | ICD-10-CM | POA: Diagnosis not present

## 2023-09-20 DIAGNOSIS — M71341 Other bursal cyst, right hand: Secondary | ICD-10-CM | POA: Diagnosis not present

## 2023-09-25 DIAGNOSIS — K080 Exfoliation of teeth due to systemic causes: Secondary | ICD-10-CM | POA: Diagnosis not present

## 2023-09-28 DIAGNOSIS — M79644 Pain in right finger(s): Secondary | ICD-10-CM | POA: Diagnosis not present

## 2023-10-16 DIAGNOSIS — K760 Fatty (change of) liver, not elsewhere classified: Secondary | ICD-10-CM | POA: Diagnosis not present

## 2023-10-16 DIAGNOSIS — I701 Atherosclerosis of renal artery: Secondary | ICD-10-CM | POA: Diagnosis not present

## 2023-10-27 DIAGNOSIS — M67441 Ganglion, right hand: Secondary | ICD-10-CM | POA: Diagnosis not present

## 2023-11-10 DIAGNOSIS — M25641 Stiffness of right hand, not elsewhere classified: Secondary | ICD-10-CM | POA: Diagnosis not present

## 2023-11-14 DIAGNOSIS — M25641 Stiffness of right hand, not elsewhere classified: Secondary | ICD-10-CM | POA: Diagnosis not present

## 2023-11-16 DIAGNOSIS — M25641 Stiffness of right hand, not elsewhere classified: Secondary | ICD-10-CM | POA: Diagnosis not present

## 2023-11-24 DIAGNOSIS — M25641 Stiffness of right hand, not elsewhere classified: Secondary | ICD-10-CM | POA: Diagnosis not present

## 2023-11-28 DIAGNOSIS — M25641 Stiffness of right hand, not elsewhere classified: Secondary | ICD-10-CM | POA: Diagnosis not present

## 2023-12-01 DIAGNOSIS — M25641 Stiffness of right hand, not elsewhere classified: Secondary | ICD-10-CM | POA: Diagnosis not present

## 2023-12-05 DIAGNOSIS — M25641 Stiffness of right hand, not elsewhere classified: Secondary | ICD-10-CM | POA: Diagnosis not present

## 2023-12-07 DIAGNOSIS — D123 Benign neoplasm of transverse colon: Secondary | ICD-10-CM | POA: Diagnosis not present

## 2023-12-07 DIAGNOSIS — D120 Benign neoplasm of cecum: Secondary | ICD-10-CM | POA: Diagnosis not present

## 2023-12-07 DIAGNOSIS — D126 Benign neoplasm of colon, unspecified: Secondary | ICD-10-CM | POA: Diagnosis not present

## 2023-12-07 DIAGNOSIS — K640 First degree hemorrhoids: Secondary | ICD-10-CM | POA: Diagnosis not present

## 2023-12-07 DIAGNOSIS — Z860101 Personal history of adenomatous and serrated colon polyps: Secondary | ICD-10-CM | POA: Diagnosis not present

## 2023-12-07 DIAGNOSIS — K5730 Diverticulosis of large intestine without perforation or abscess without bleeding: Secondary | ICD-10-CM | POA: Diagnosis not present

## 2023-12-07 DIAGNOSIS — D122 Benign neoplasm of ascending colon: Secondary | ICD-10-CM | POA: Diagnosis not present

## 2023-12-07 DIAGNOSIS — Z09 Encounter for follow-up examination after completed treatment for conditions other than malignant neoplasm: Secondary | ICD-10-CM | POA: Diagnosis not present

## 2023-12-07 DIAGNOSIS — D124 Benign neoplasm of descending colon: Secondary | ICD-10-CM | POA: Diagnosis not present

## 2023-12-08 DIAGNOSIS — M25641 Stiffness of right hand, not elsewhere classified: Secondary | ICD-10-CM | POA: Diagnosis not present

## 2023-12-12 DIAGNOSIS — M25641 Stiffness of right hand, not elsewhere classified: Secondary | ICD-10-CM | POA: Diagnosis not present

## 2023-12-15 DIAGNOSIS — M25641 Stiffness of right hand, not elsewhere classified: Secondary | ICD-10-CM | POA: Diagnosis not present

## 2023-12-19 DIAGNOSIS — M25641 Stiffness of right hand, not elsewhere classified: Secondary | ICD-10-CM | POA: Diagnosis not present

## 2023-12-25 DIAGNOSIS — H5203 Hypermetropia, bilateral: Secondary | ICD-10-CM | POA: Diagnosis not present

## 2023-12-28 DIAGNOSIS — M25641 Stiffness of right hand, not elsewhere classified: Secondary | ICD-10-CM | POA: Diagnosis not present

## 2024-01-04 DIAGNOSIS — M25641 Stiffness of right hand, not elsewhere classified: Secondary | ICD-10-CM | POA: Diagnosis not present

## 2024-01-11 DIAGNOSIS — M25641 Stiffness of right hand, not elsewhere classified: Secondary | ICD-10-CM | POA: Diagnosis not present

## 2024-01-18 DIAGNOSIS — M25641 Stiffness of right hand, not elsewhere classified: Secondary | ICD-10-CM | POA: Diagnosis not present

## 2024-01-23 DIAGNOSIS — C61 Malignant neoplasm of prostate: Secondary | ICD-10-CM | POA: Diagnosis not present

## 2024-01-30 DIAGNOSIS — C61 Malignant neoplasm of prostate: Secondary | ICD-10-CM | POA: Diagnosis not present

## 2024-01-30 DIAGNOSIS — N5201 Erectile dysfunction due to arterial insufficiency: Secondary | ICD-10-CM | POA: Diagnosis not present

## 2024-01-30 DIAGNOSIS — N393 Stress incontinence (female) (male): Secondary | ICD-10-CM | POA: Diagnosis not present

## 2024-02-01 DIAGNOSIS — K080 Exfoliation of teeth due to systemic causes: Secondary | ICD-10-CM | POA: Diagnosis not present

## 2024-02-08 DIAGNOSIS — E785 Hyperlipidemia, unspecified: Secondary | ICD-10-CM | POA: Diagnosis not present

## 2024-02-08 DIAGNOSIS — R7309 Other abnormal glucose: Secondary | ICD-10-CM | POA: Diagnosis not present

## 2024-02-08 DIAGNOSIS — C61 Malignant neoplasm of prostate: Secondary | ICD-10-CM | POA: Diagnosis not present

## 2024-02-08 DIAGNOSIS — Z0000 Encounter for general adult medical examination without abnormal findings: Secondary | ICD-10-CM | POA: Diagnosis not present

## 2024-02-12 DIAGNOSIS — I10 Essential (primary) hypertension: Secondary | ICD-10-CM | POA: Diagnosis not present

## 2024-02-12 DIAGNOSIS — G479 Sleep disorder, unspecified: Secondary | ICD-10-CM | POA: Diagnosis not present

## 2024-02-12 DIAGNOSIS — Z0000 Encounter for general adult medical examination without abnormal findings: Secondary | ICD-10-CM | POA: Diagnosis not present

## 2024-02-12 DIAGNOSIS — E785 Hyperlipidemia, unspecified: Secondary | ICD-10-CM | POA: Diagnosis not present

## 2024-02-12 DIAGNOSIS — F419 Anxiety disorder, unspecified: Secondary | ICD-10-CM | POA: Diagnosis not present

## 2024-03-26 ENCOUNTER — Other Ambulatory Visit (HOSPITAL_BASED_OUTPATIENT_CLINIC_OR_DEPARTMENT_OTHER): Payer: Self-pay | Admitting: Cardiovascular Disease

## 2024-04-24 ENCOUNTER — Encounter (HOSPITAL_BASED_OUTPATIENT_CLINIC_OR_DEPARTMENT_OTHER)

## 2024-05-02 ENCOUNTER — Ambulatory Visit (HOSPITAL_COMMUNITY)

## 2024-05-10 ENCOUNTER — Encounter (HOSPITAL_BASED_OUTPATIENT_CLINIC_OR_DEPARTMENT_OTHER): Admitting: Cardiovascular Disease
